# Patient Record
Sex: Male | Born: 1985 | Race: Black or African American | Hispanic: No | Marital: Single
Health system: Southern US, Community
[De-identification: ages and names within clinical notes are randomized; demographics above are authoritative.]

## PROBLEM LIST (undated history)

## (undated) DIAGNOSIS — K089 Disorder of teeth and supporting structures, unspecified: Secondary | ICD-10-CM

## (undated) DIAGNOSIS — F840 Autistic disorder: Secondary | ICD-10-CM

## (undated) DIAGNOSIS — F79 Unspecified intellectual disabilities: Secondary | ICD-10-CM

## (undated) DIAGNOSIS — E119 Type 2 diabetes mellitus without complications: Secondary | ICD-10-CM

---
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
## 2006-05-16 ENCOUNTER — Emergency Department (HOSPITAL_COMMUNITY): Admission: EM | Admit: 2006-05-16 | Discharge: 2006-05-16 | Payer: Self-pay | Admitting: Family Medicine

## 2006-05-20 ENCOUNTER — Ambulatory Visit: Payer: Self-pay | Admitting: Nurse Practitioner

## 2006-06-03 ENCOUNTER — Ambulatory Visit (HOSPITAL_COMMUNITY): Admission: RE | Admit: 2006-06-03 | Discharge: 2006-06-03 | Payer: Self-pay | Admitting: Otolaryngology

## 2006-06-19 ENCOUNTER — Ambulatory Visit (HOSPITAL_COMMUNITY): Admission: RE | Admit: 2006-06-19 | Discharge: 2006-06-19 | Payer: Self-pay | Admitting: Otolaryngology

## 2007-05-23 ENCOUNTER — Emergency Department (HOSPITAL_COMMUNITY): Admission: EM | Admit: 2007-05-23 | Discharge: 2007-05-23 | Payer: Self-pay | Admitting: Emergency Medicine

## 2010-12-29 NOTE — Op Note (Signed)
Glenn [name], Glenn [name]               ACCOUNT NO.:  [id number]   MEDICAL RECORD NO.:  [id number]          PATIENT TYPE:  AMB   LOCATION:  SDS                          FACILITY:  [facility]   PHYSICIAN:  Zola Button T. Lazarus Salines, M.D. DATE OF BIRTH:  [birth year]   DATE OF PROCEDURE:  06/19/2006  DATE OF DISCHARGE:                                 OPERATIVE REPORT   PREOPERATIVE DIAGNOSIS:  Left auricular hematoma, status post incision and  drainage.   POSTOP DIAGNOSIS:  Left auricular hematoma, status post incision and  drainage.   PROCEDURE PERFORMED:  Suture removal, left auricle.   SURGEON:  Gloris Manchester. Lazarus Salines, M.D.   ANESTHESIA:  General mask.   BLOOD LOSS:  None.   COMPLICATIONS:  None.   FINDINGS:  Quilting sutures in the upper left pinna.  Status post incision  and drainage and quilting of an auricular hematoma.  Slight thickening, but  otherwise satisfactory configuration of the ear.  No signs of infection or  residual hematoma.   PROCEDURE:  With the patient having received oral Versed and intramuscular  ketamine in the holding area, mask anesthesia was administered.  An  appropriate level, intravenous access was obtained.  With the patient in a  controlled situation, the left ear was examined with the findings as  described above.  Sutures were removed without difficulty.  At this point  the procedure was completed; and the patient was returned to anesthesia,  awakened, and transferred to recovery in stable condition.   COMMENT:  A 25 year old black male with autism, status post incision and  drainage of a left auricular hematoma approximately 2 weeks ago.  With his  behavioral issues being the primary reason requiring anesthesia for suture  removal.  Given low anticipated risk of postanesthetic or postsurgical  complications, I feel an outpatient venue is appropriate.      Gloris Manchester. Lazarus Salines, M.D.  Electronically Signed     KTW/MEDQ  D:  06/19/2006  T:  06/19/2006  Job:   [id number]
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
## 2011-11-04 ENCOUNTER — Encounter (HOSPITAL_COMMUNITY): Payer: Self-pay | Admitting: General Practice

## 2011-11-04 ENCOUNTER — Emergency Department (HOSPITAL_COMMUNITY)
Admission: EM | Admit: 2011-11-04 | Discharge: 2011-11-04 | Disposition: A | Discharge status: Home / Self Care | Payer: Self-pay | Attending: Emergency Medicine | Admitting: Emergency Medicine

## 2011-11-04 DIAGNOSIS — F79 Unspecified intellectual disabilities: Secondary | ICD-10-CM | POA: Insufficient documentation

## 2011-11-04 DIAGNOSIS — IMO0002 Reserved for concepts with insufficient information to code with codable children: Secondary | ICD-10-CM | POA: Insufficient documentation

## 2011-11-04 DIAGNOSIS — R451 Restlessness and agitation: Secondary | ICD-10-CM

## 2011-11-04 DIAGNOSIS — F840 Autistic disorder: Secondary | ICD-10-CM | POA: Insufficient documentation

## 2011-11-04 HISTORY — DX: Autistic disorder: F84.0

## 2011-11-04 HISTORY — DX: Unspecified intellectual disabilities: F79

## 2011-11-04 MED ORDER — ZOLPIDEM TARTRATE 5 MG PO TABS
5.0000 mg | ORAL_TABLET | Freq: Every evening | ORAL | Status: DC | PRN
Start: 2011-11-04 — End: 2012-06-12

## 2011-11-04 NOTE — ED Provider Notes (Signed)
History     CSN: [id number]  Arrival date & time 11/04/11  0445   First MD Initiated Contact with Patient 11/04/11 0540      Chief Complaint  Patient presents with  . Agitation    (Consider location/radiation/quality/duration/timing/severity/associated sxs/prior treatment) HPI Comments: 26 year old male with a history of mental retardation and autism who takes Seroquel daily presents with increased agitation and mood swings over the last couple of days. The mother states that he has been sleeping okay at night but during the day he sometimes becomes more agitated and has been swinging at walls and that her. She states that this has been intermittent over his life, she does not feel any concern for her safety and states that the hitting his normal behavior for him. She also states that he has been biting on his fingers occasionally. There is no fevers or vomiting chills rashes swelling diarrhea or any other complaints. He has been seen by mental health and in treated with Seroquel with minimal improvement since November.  The patient's baseline is that he is ambulatory but does not speak because of his severe mental retardation  The history is provided by a parent.    Past Medical History  Diagnosis Date  . Autism   . Mental retardation     History reviewed. No pertinent past surgical history.  No family history on file.  History  Substance Use Topics  . Smoking status: Never Smoker   . Smokeless tobacco: Not on file  . Alcohol Use: No      Review of Systems  Unable to perform ROS: Other    Allergies  Review of patient's allergies indicates no known allergies.  Home Medications   Current Outpatient Rx  Name Route Sig Dispense Refill  . QUETIAPINE FUMARATE 100 MG PO TABS Oral Take 200-300 mg by mouth 2 (two) times daily. Takes 200mg  in the morning and 300mg  in the evening    . ZOLPIDEM TARTRATE 5 MG PO TABS Oral Take 1 tablet (5 mg total) by mouth at bedtime as  needed for sleep. 5 tablet 0    Pulse 65  SpO2 94%  Physical Exam  Nursing note and vitals reviewed. Constitutional: He appears well-developed and well-nourished. No distress.  HENT:  Head: Normocephalic and atraumatic.  Mouth/Throat: Oropharynx is clear and moist. No oropharyngeal exudate.  Eyes: Conjunctivae and EOM are normal. Pupils are equal, round, and reactive to light. Right eye exhibits no discharge. Left eye exhibits no discharge. No scleral icterus.  Neck: Normal range of motion. Neck supple. No JVD present. No thyromegaly present.  Cardiovascular: Normal rate, regular rhythm, normal heart sounds and intact distal pulses.  Exam reveals no gallop and no friction rub.   No murmur heard. Pulmonary/Chest: Effort normal and breath sounds normal. No respiratory distress. He has no wheezes. He has no rales.  Abdominal: Soft. Bowel sounds are normal. He exhibits no distension and no mass. There is no tenderness.  Musculoskeletal: Normal range of motion. He exhibits no edema and no tenderness.  Lymphadenopathy:    He has no cervical adenopathy.  Neurological: He is alert. Coordination normal.  Skin: Skin is warm and dry. No rash noted. No erythema.  Psychiatric:       Patient presents physical exam but is willing to hold the stethoscope in his hand and place and where it needs to go. Opens mouth and has clear mucous membranes with moist mucous membranes, clear conjunctiva, normal pupillary exam, follows commands  ED Course  Procedures (including critical care time)  Labs Reviewed - No data to display No results found.   1. Mental retardation   2. Autism   3. Agitation       MDM  Patient appears at his baseline at this time, mother is in agreement, I do not see the need to do laboratory workup were extensive testing as it appears that he has slight increased agitation but can followup safely at mental health tomorrow. The mother states this is her plan but will bring him  back should his symptoms worsen. I think this is stable and he should be safe for discharge at this time.        Vida Roller, MD 11/04/11 [phone]

## 2011-11-04 NOTE — ED Notes (Signed)
With mother, states pt is low functioning autistic pt. Reports pt has been biting hands and fingers and punching walls.

## 2011-11-04 NOTE — Discharge Instructions (Signed)
Please take Glenn [name] to his doctor on Monday morning for a repeat evaluation. Return to the hospital at [facility] long should his symptoms worsen.

## 2012-06-12 ENCOUNTER — Emergency Department (HOSPITAL_COMMUNITY)
Admission: EM | Admit: 2012-06-12 | Discharge: 2012-06-13 | Disposition: A | Discharge status: Home / Self Care | Payer: Medicaid Other | Attending: Emergency Medicine | Admitting: Emergency Medicine

## 2012-06-12 ENCOUNTER — Encounter (HOSPITAL_COMMUNITY): Payer: Self-pay | Admitting: Adult Health

## 2012-06-12 DIAGNOSIS — G4700 Insomnia, unspecified: Secondary | ICD-10-CM

## 2012-06-12 DIAGNOSIS — R454 Irritability and anger: Secondary | ICD-10-CM

## 2012-06-12 DIAGNOSIS — F911 Conduct disorder, childhood-onset type: Secondary | ICD-10-CM | POA: Insufficient documentation

## 2012-06-12 DIAGNOSIS — F840 Autistic disorder: Secondary | ICD-10-CM | POA: Insufficient documentation

## 2012-06-12 DIAGNOSIS — F79 Unspecified intellectual disabilities: Secondary | ICD-10-CM | POA: Insufficient documentation

## 2012-06-12 NOTE — ED Notes (Signed)
Autistic, mute pt with mother that had an episode of anger with mother this evening. Mother states he was hitting wall and at times that is like him. Pt is diaphoretic, pale and has some redness to eyes. Mother states he has had some mucuos and cough.  Mother is also concerned because he has not been able to sleep for one week.

## 2012-06-12 NOTE — ED Notes (Signed)
Pt will not allow [place] to do Vital signs

## 2012-06-12 NOTE — ED Notes (Signed)
Pt. Autistic, mute. Mother at bedside. Concerned with patient increasing aggression x 1 week. Pt on Risperdal since august.

## 2012-06-13 MED ORDER — LORAZEPAM 1 MG PO TABS
1.0000 mg | ORAL_TABLET | Freq: Once | ORAL | Status: AC
  Administered 2012-06-13: 1 mg via ORAL
  Filled 2012-06-13: qty 1

## 2012-06-13 MED ORDER — ZOLPIDEM TARTRATE 5 MG PO TABS: 5.0000 mg | ORAL_TABLET | Freq: Every evening | ORAL | Status: DC | PRN

## 2012-06-13 NOTE — ED Notes (Signed)
Unable to obtain vital signs. Pt. Becomes aggressive.

## 2012-06-13 NOTE — ED Provider Notes (Addendum)
History     CSN: [id number]  Arrival date & time 06/12/12  2253   First MD Initiated Contact with Patient 06/13/12 0007      Chief Complaint  Patient presents with  . Fatigue    (Consider location/radiation/quality/duration/timing/severity/associated sxs/prior treatment) The history is provided by a parent. The history is limited by the condition of the patient (autism).   26 year old of cystic male had an episode of anger and seeming repackaged wall. This is not unusual for him and there was no apparent injury. Mother is noted some nasal congestion but no fever or chills. Mother also states that he has not slept for the last 5 days.  Past Medical History  Diagnosis Date  . Autism   . Mental retardation     History reviewed. No pertinent past surgical history.  History reviewed. No pertinent family history.  History  Substance Use Topics  . Smoking status: Never Smoker   . Smokeless tobacco: Not on file  . Alcohol Use: No      Review of Systems  Unable to perform ROS: Psychiatric disorder    Allergies  Review of patient's allergies indicates no known allergies.  Home Medications   Current Outpatient Rx  Name Route Sig Dispense Refill  . RISPERIDONE 2 MG PO TABS Oral Take 2 mg by mouth 3 (three) times daily.    Marland Kitchen ZOLPIDEM TARTRATE 5 MG PO TABS Oral Take 5 mg by mouth at bedtime as needed. For sleep      There were no vitals taken for this visit.  Physical Exam  Nursing note and vitals reviewed. 26 year old male, resting comfortably and in no acute distress. Vital signs are unable to be obtained because of poor cooperation, but he is noted to have normal respirations and normal heart rate on my physical exam, and he does not appear to have a fever by touch. Head is normocephalic and atraumatic. PERRLA, EOMI. Oropharynx is clear. Neck is nontender and supple without adenopathy or JVD. Back is nontender and there is no CVA tenderness. Lungs are clear without  rales, wheezes, or rhonchi. Chest is nontender. Heart has regular rate and rhythm without murmur. Abdomen is soft, flat, nontender without masses or hepatosplenomegaly and peristalsis is normoactive. Extremities have no cyanosis or edema, full range of motion is present. Skin is warm and dry without rash. Neurologic: He is awake and alert and reasonably cooperative. He will move my stethoscope to the area that needs to be checked under my direction although he'll not allow me to place a stethoscope on my own, cranial nerves are intact, there are no gross motor or sensory deficits.   ED Course  Procedures (including critical care time)    1. Anger   2. Insomnia       MDM  Anger episode, and insomnia. He is given a dose of lorazepam in the emergency department and sent home with a prescription for zolpidem. Prior charts are reviewed and he had an emergency department visit with similar complaints in March of this year which was also treated with zolpidem.        Dione Booze, MD 06/13/12 7829  Dione Booze, MD 06/13/12 [phone]

## 2013-09-30 ENCOUNTER — Emergency Department (INDEPENDENT_AMBULATORY_CARE_PROVIDER_SITE_OTHER)
Admission: EM | Admit: 2013-09-30 | Discharge: 2013-09-30 | Disposition: A | Discharge status: Home / Self Care | Payer: Medicaid Other | Source: Home / Self Care | Attending: Emergency Medicine | Admitting: Emergency Medicine

## 2013-09-30 ENCOUNTER — Emergency Department (INDEPENDENT_AMBULATORY_CARE_PROVIDER_SITE_OTHER): Payer: Medicaid Other

## 2013-09-30 ENCOUNTER — Encounter (HOSPITAL_COMMUNITY): Payer: Self-pay | Admitting: Emergency Medicine

## 2013-09-30 DIAGNOSIS — S60229A Contusion of unspecified hand, initial encounter: Secondary | ICD-10-CM

## 2013-09-30 DIAGNOSIS — S60222A Contusion of left hand, initial encounter: Secondary | ICD-10-CM
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
## 2013-09-30 NOTE — ED Provider Notes (Signed)
Medical screening examination/treatment/procedure(s) were performed by non-physician practitioner and as supervising physician I was immediately available for consultation/collaboration.  Leslee Homeavid Terelle Dobler, M.D.  Reuben Likesavid C Chelle Cayton, MD 09/30/13 [phone]

## 2013-09-30 NOTE — Discharge Instructions (Signed)
Hand Contusion A hand contusion is a deep bruise on your hand area. Contusions are the result of an injury that caused bleeding under the skin. The contusion may turn blue, purple, or yellow. Minor injuries will give you a painless contusion, but more severe contusions may stay painful and swollen for a few weeks. CAUSES  A contusion is usually caused by a blow, trauma, or direct force to an area of the body. SYMPTOMS   Swelling and redness of the injured area.  Discoloration of the injured area.  Tenderness and soreness of the injured area.  Pain. DIAGNOSIS  The diagnosis can be made by taking a history and performing a physical exam. An X-ray, CT scan, or MRI may be needed to determine if there were any associated injuries, such as broken bones (fractures). TREATMENT  Often, the best treatment for a hand contusion is resting, elevating, icing, and applying cold compresses to the injured area. Over-the-counter medicines may also be recommended for pain control. HOME CARE INSTRUCTIONS   Put ice on the injured area.  Put ice in a plastic bag.  Place a towel between your skin and the bag.  Leave the ice on for 15-20 minutes, 03-04 times a day.  Only take over-the-counter or prescription medicines as directed by your caregiver. Your caregiver may recommend avoiding anti-inflammatory medicines (aspirin, ibuprofen, and naproxen) for 48 hours because these medicines may increase bruising.  If told, use an elastic wrap as directed. This can help reduce swelling. You may remove the wrap for sleeping, showering, and bathing. If your fingers become numb, cold, or blue, take the wrap off and reapply it more loosely.  Elevate your hand with pillows to reduce swelling.  Avoid overusing your hand if it is painful. SEEK IMMEDIATE MEDICAL CARE IF:   You have increased redness, swelling, or pain in your hand.  Your swelling or pain is not relieved with medicines.  You have loss of feeling in  your hand or are unable to move your fingers.  Your hand turns cold or blue.  You have pain when you move your fingers.  Your hand becomes warm to the touch.  Your contusion does not improve in 2 days. MAKE SURE YOU:   Understand these instructions.  Will watch your condition.  Will get help right away if you are not doing well or get worse. Document Released: 01/19/2002 Document Revised: 04/23/2012 Document Reviewed: 01/21/2012 [facility] Patient Information 2014 [place], [place].   Your son's xrays were normal. Please keep dressing placed at the urgent care in place for 24 hours to help reduce swelling, then you may remove. Expect swelling to improve over next few days. Ice and elevation will also help reduce pain and swelling.

## 2013-09-30 NOTE — ED Provider Notes (Signed)
CSN: [id number]     Arrival date & time 09/30/13  1230 History   First MD Initiated Contact with Patient 09/30/13 1429     Chief Complaint  Patient presents with  . Hand Injury     (Consider location/radiation/quality/duration/timing/severity/associated sxs/prior Treatment) HPI Comments: [facility] who provide his history as he is non-verbal as a result of autism and severe developmental delay. Mother reports that patient's sister noticed a swollen area on the dorsum of his left hand today and then made mother aware of finding. Mother states that child can become aggressive and will often hit objects when he is angry but she is uncertain if specific injury has occurred.   Patient is a 28 y.o. male presenting with hand injury. The history is provided by a parent.  Hand Injury   Past Medical History  Diagnosis Date  . Autism   . Mental retardation    History reviewed. No pertinent past surgical history. No family history on file. History  Substance Use Topics  . Smoking status: Never Smoker   . Smokeless tobacco: Not on file  . Alcohol Use: No    Review of Systems  Unable to perform ROS: Patient nonverbal      Allergies  Review of patient's allergies indicates no known allergies.  Home Medications   Current Outpatient Rx  Name  Route  Sig  Dispense  Refill  . QUEtiapine Fumarate (SEROQUEL PO)   Oral   Take by mouth.         . risperiDONE (RISPERDAL) 2 MG tablet   Oral   Take 2 mg by mouth 3 (three) times daily.         Marland Kitchen. zolpidem (AMBIEN) 5 MG tablet   Oral   Take 5 mg by mouth at bedtime as needed. For sleep         . zolpidem (AMBIEN) 5 MG tablet   Oral   Take 1 tablet (5 mg total) by mouth at bedtime as needed for sleep.   5 tablet   0    Pulse 98  Temp(Src) 98.2 F (36.8 C) (Oral)  Resp 99  SpO2 99% Physical Exam  Nursing note and vitals reviewed. Constitutional: He appears well-developed and well-nourished. No  distress.  HENT:  Head: Normocephalic and atraumatic.  Eyes: Conjunctivae are normal.  Cardiovascular: Normal rate.   Pulmonary/Chest: Effort normal.  RR during my exam found to be 16 breaths per minute  Musculoskeletal: Normal range of motion.       Left hand: He exhibits tenderness and swelling. He exhibits normal range of motion, no bony tenderness, no deformity and no laceration. Decreased strength noted. He exhibits no finger abduction.       Hands: Normal ROM at left hand, wrist and fingers.  Neurological: He is alert.  Skin: Skin is warm and dry.  Psychiatric: He has a normal mood and affect. His behavior is normal.    ED Course  Procedures (including critical care time) Labs Review Labs Reviewed - No data to display Imaging Review No results found.    MDM   Final diagnoses:  None  Left hand contusion with hematoma. Radiographs negative for fx or dislocation. . Will place in padded Ace wrap dressing (Watson-Jones dressing) and advise parents to keep elevated as much as possible and to remove dressing in 24-36 hours.     Jess BartersJennifer Lee ScottdalePresson, GeorgiaPA 09/30/13 [phone]

## 2013-09-30 NOTE — ED Notes (Signed)
Ace wrap patient's left hand due to swelling

## 2015-06-20 ENCOUNTER — Emergency Department (HOSPITAL_COMMUNITY)
Admission: EM | Admit: 2015-06-20 | Discharge: 2015-06-20 | Disposition: A | Discharge status: Home / Self Care | Payer: Medicaid Other | Attending: Emergency Medicine | Admitting: Emergency Medicine

## 2015-06-20 ENCOUNTER — Encounter (HOSPITAL_COMMUNITY): Payer: Self-pay | Admitting: *Deleted

## 2015-06-20 DIAGNOSIS — Y92410 Unspecified street and highway as the place of occurrence of the external cause: Secondary | ICD-10-CM | POA: Diagnosis not present

## 2015-06-20 DIAGNOSIS — F840 Autistic disorder: Secondary | ICD-10-CM | POA: Insufficient documentation

## 2015-06-20 DIAGNOSIS — Z041 Encounter for examination and observation following transport accident: Secondary | ICD-10-CM | POA: Diagnosis not present

## 2015-06-20 DIAGNOSIS — Y9389 Activity, other specified: Secondary | ICD-10-CM | POA: Insufficient documentation

## 2015-06-20 DIAGNOSIS — Y999 Unspecified external cause status: Secondary | ICD-10-CM | POA: Diagnosis not present

## 2015-06-20 DIAGNOSIS — Z79899 Other long term (current) drug therapy: Secondary | ICD-10-CM | POA: Insufficient documentation

## 2015-06-20 MED ORDER — IBUPROFEN 800 MG PO TABS: 800.0000 mg | ORAL_TABLET | Freq: Once | ORAL | Status: DC

## 2015-06-20 MED ORDER — CYCLOBENZAPRINE HCL 10 MG PO TABS: 5.0000 mg | ORAL_TABLET | Freq: Once | ORAL | Status: DC

## 2015-06-20 NOTE — ED Notes (Signed)
Attempted to take vitals on patient. Patient refused to have his vitals taken for the second time.
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
## 2015-06-20 NOTE — Discharge Instructions (Signed)

## 2015-06-20 NOTE — ED Notes (Signed)
Patient did not let me take his vitals

## 2015-06-20 NOTE — ED Notes (Signed)
pts mother states that pt is acting himself. No difficulty ambulating, no change in mental status, no change in appetite.
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
## 2016-06-06 ENCOUNTER — Encounter (HOSPITAL_COMMUNITY): Payer: Self-pay | Admitting: Emergency Medicine

## 2016-06-06 ENCOUNTER — Ambulatory Visit (HOSPITAL_COMMUNITY)
Admission: EM | Admit: 2016-06-06 | Discharge: 2016-06-06 | Disposition: A | Discharge status: Home / Self Care | Payer: Medicaid Other | Attending: Emergency Medicine | Admitting: Emergency Medicine

## 2016-06-06 DIAGNOSIS — H1033 Unspecified acute conjunctivitis, bilateral: Secondary | ICD-10-CM | POA: Diagnosis not present

## 2016-06-06 MED ORDER — GENTAMICIN SULFATE 0.3 % OP SOLN: 1.0000 [drp] | OPHTHALMIC | 0 refills | Status: AC

## 2016-06-06 NOTE — ED Provider Notes (Signed)
CSN: [id number]     Arrival date & time 06/06/16  1119 History   First MD Initiated Contact with Patient 06/06/16 1335     Chief Complaint  Patient presents with  . Eye Problem   (Consider location/radiation/quality/duration/timing/severity/associated sxs/prior Treatment) 30 year old autistic male brought in by his caregiver with concern over bilateral eye discharge and redness for the past 2 days. Wakes up with both eyes matted shut. Mom has been using warm compresses and Visine with minimal relief. No fever, cough or GI symptoms. Has had some nasal congestion and drainage for the past week.    The history is provided by a parent.    Past Medical History:  Diagnosis Date  . Autism   . Mental retardation    History reviewed. No pertinent surgical history. History reviewed. No pertinent family history. Social History  Substance Use Topics  . Smoking status: Never Smoker  . Smokeless tobacco: Never Used  . Alcohol use No    Review of Systems  Constitutional: Negative for chills, fatigue and fever.  HENT: Positive for congestion, postnasal drip and rhinorrhea. Negative for ear discharge, ear pain and sore throat.   Eyes: Positive for discharge, redness and itching. Negative for pain.  Respiratory: Negative for cough and wheezing.   Gastrointestinal: Negative for diarrhea and vomiting.  Skin: Negative for rash.  Neurological: Negative for syncope and weakness.    Allergies  Review of patient's allergies indicates no known allergies.  Home Medications   Prior to Admission medications   Medication Sig Start Date End Date Taking? Authorizing Provider  QUEtiapine Fumarate (SEROQUEL PO) Take by mouth.   Yes Historical Provider, MD  risperiDONE (RISPERDAL) 2 MG tablet Take 2 mg by mouth 3 (three) times daily.   Yes Historical Provider, MD  gentamicin (GARAMYCIN) 0.3 % ophthalmic solution Place 1 drop into both eyes every 4 (four) hours. 06/06/16 06/11/16  Sudie Grumbling, NP   zolpidem (AMBIEN) 5 MG tablet Take 5 mg by mouth at bedtime as needed. For sleep    Historical Provider, MD  zolpidem (AMBIEN) 5 MG tablet Take 1 tablet (5 mg total) by mouth at bedtime as needed for sleep. 06/13/12   Dione Booze, MD   Meds Ordered and Administered this Visit  Medications - No data to display  Pulse 111   Temp 97.6 F (36.4 C) (Temporal)   Resp 20   SpO2 100%  No data found.   Physical Exam  Constitutional: He appears well-developed and well-nourished. He is cooperative. No distress.  HENT:  Head: Normocephalic and atraumatic.  Right Ear: Hearing, tympanic membrane, external ear and ear canal normal.  Left Ear: Hearing, tympanic membrane, external ear and ear canal normal.  Nose: Rhinorrhea present. Right sinus exhibits no maxillary sinus tenderness and no frontal sinus tenderness. Left sinus exhibits no maxillary sinus tenderness and no frontal sinus tenderness.  Mouth/Throat: Uvula is midline, oropharynx is clear and moist and mucous membranes are normal.  Eyes: EOM and lids are normal. Pupils are equal, round, and reactive to light. Right eye exhibits exudate (yellow). Right eye exhibits no hordeolum. Left eye exhibits exudate. Left eye exhibits no hordeolum. Right conjunctiva is injected. Left conjunctiva is injected.  Fundoscopic exam:      The right eye shows red reflex.       The left eye shows red reflex.  Neck: Normal range of motion. Neck supple.  Cardiovascular: Normal rate, regular rhythm and normal heart sounds.   Pulmonary/Chest: Effort normal and  breath sounds normal.  Lymphadenopathy:    He has no cervical adenopathy.  Neurological: He is alert.  Skin: Skin is warm and dry.  Psychiatric: He is agitated.    Urgent Care Course   Clinical Course    Procedures (including critical care time)  Labs Review Labs Reviewed - No data to display  Imaging Review No results found.   Visual Acuity Review  Right Eye Distance:   Left Eye  Distance:   Bilateral Distance:    Right Eye Near:   Left Eye Near:    Bilateral Near:         MDM   1. Acute bacterial conjunctivitis of both eyes    Recommend use Gentamicin eye drops every 4 hours while awake as directed. Continue warm compresses to eyes for comfort. Discussed contagious nature of pink eye- no adult school for 24 hours. Recommend follow-up with his primary care provider in 2 to 3 days if not improving.      Sudie GrumblingAnn Berry Julliette Frentz, NP 06/07/16 1049

## 2016-06-06 NOTE — Discharge Instructions (Signed)
Use Gentamicin drops- 1 drop in each eye about every 4 hours while awake for 5 days. May continue using warm compresses as needed for comfort. Follow-up with your primary care provider in 2 days if not improving.
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
## 2016-10-27 ENCOUNTER — Encounter (HOSPITAL_COMMUNITY): Payer: Self-pay | Admitting: Nurse Practitioner

## 2016-10-27 ENCOUNTER — Emergency Department (HOSPITAL_COMMUNITY)
Admission: EM | Admit: 2016-10-27 | Discharge: 2016-10-27 | Disposition: A | Discharge status: Home / Self Care | Payer: Medicaid Other | Attending: Emergency Medicine | Admitting: Emergency Medicine

## 2016-10-27 DIAGNOSIS — F840 Autistic disorder: Secondary | ICD-10-CM | POA: Diagnosis not present

## 2016-10-27 DIAGNOSIS — Z0000 Encounter for general adult medical examination without abnormal findings: Secondary | ICD-10-CM | POA: Diagnosis present

## 2016-10-27 LAB — URINALYSIS, ROUTINE W REFLEX MICROSCOPIC
Bacteria, UA: NONE SEEN
Bilirubin Urine: NEGATIVE
Glucose, UA: NEGATIVE mg/dL
Ketones, ur: NEGATIVE mg/dL
Leukocytes, UA: NEGATIVE
Nitrite: NEGATIVE
PROTEIN: NEGATIVE mg/dL
SQUAMOUS EPITHELIAL / LPF: NONE SEEN
Specific Gravity, Urine: 1.012 (ref 1.005–1.030)
WBC, UA: NONE SEEN WBC/hpf (ref 0–5)
pH: 7 (ref 5.0–8.0)

## 2016-10-27 NOTE — ED Triage Notes (Signed)
Pt presents with c/o needing lab work. He has severe autism and anxiety. He is completley nonverbal. His mother took him to his PCP this week for a routine follow-up of his chronic medical conditions but they were unable to obtain any vital signs or blood work from the patient due to his behavior. He swung his arms towards his PCP while she tried to examine him and obtain his VS. She did treat him with 10mg  valium in the office with no change in behavior.The decision was made to send pt to ED for possible sedation and lab work due to family concern for wanting lab work.

## 2016-10-27 NOTE — Discharge Instructions (Signed)
If you were given medicines take as directed.  If you are on coumadin or contraceptives realize their levels and effectiveness is altered by many different medicines.  If you have any reaction (rash, tongues swelling, other) to the medicines stop taking and see a physician.    If your blood pressure was elevated in the ER make sure you follow up for management with a primary doctor or return for chest pain, shortness of breath or stroke symptoms.  Please follow up as directed and return to the ER or see a physician for new or worsening symptoms.  Thank you. Vitals:   10/27/16 1537  Pulse: 90  Resp: 19

## 2016-10-27 NOTE — ED Provider Notes (Signed)
[facility] Provider Note   CSN: [id number] Arrival date & time: 10/27/16  1257     History   Chief Complaint Chief Complaint  Patient presents with  . Labs Only    HPI Glenn [name] is a 31 y.o. male.  Patient with autism and no other significant medical history presents with mother requesting blood work. Patient was sent over from primary care's office after they were unable to get screening blood work. I clarified with mother if there are any new symptoms, I behavior, fevers, vomiting, any concerns at all and she says no. Patient is been at baseline for the past week. Intermittently he will have more aggression however that has not been for a few weeks. No other medical problems. Patient eating and drinking okay. Mother also concerned may be diabetes however she has not noticed any classic symptoms.      Past Medical History:  Diagnosis Date  . Autism   . Mental retardation     There are no active problems to display for this patient.   History reviewed. No pertinent surgical history.     Home Medications    Prior to Admission medications   Medication Sig Start Date End Date Taking? Authorizing Provider  QUEtiapine Fumarate (SEROQUEL PO) Take by mouth.    Historical Provider, MD  risperiDONE (RISPERDAL) 2 MG tablet Take 2 mg by mouth 3 (three) times daily.    Historical Provider, MD  zolpidem (AMBIEN) 5 MG tablet Take 5 mg by mouth at bedtime as needed. For sleep    Historical Provider, MD  zolpidem (AMBIEN) 5 MG tablet Take 1 tablet (5 mg total) by mouth at bedtime as needed for sleep. 06/13/12   Dione Booze, MD    Family History History reviewed. No pertinent family history.  Social History Social History  Substance Use Topics  . Smoking status: Never Smoker  . Smokeless tobacco: Never Used  . Alcohol use No     Allergies   Patient has no known allergies.   Review of Systems Review of Systems  Unable to perform ROS: Patient nonverbal      Physical Exam Updated Vital Signs Pulse 90   Resp 19   Physical Exam  Constitutional: He appears well-developed and well-nourished.  HENT:  Head: Normocephalic and atraumatic.  Eyes: Conjunctivae are normal.  Neck: Neck supple.  Cardiovascular: Normal rate and regular rhythm.   No murmur heard. Pulmonary/Chest: Effort normal and breath sounds normal. No respiratory distress.  Abdominal: Soft. There is no tenderness.  Musculoskeletal: He exhibits no edema.  Neurological: He is alert.  Patient has equal pupils bilateral, horizontal eye movements intact, patient ambulates with normal strength and normal balance. Patient has grossly 5+ strength in upper lower extremities.  Patient able to hold my stethoscope on his chest along medial listen.  Skin: Skin is warm and dry.  Psychiatric: His affect is not blunt. He is not aggressive and not combative.  Autistic  Nursing note and vitals reviewed.    ED Treatments / Results  Labs (all labs ordered are listed, but only abnormal results are displayed) Labs Reviewed  URINALYSIS, ROUTINE W REFLEX MICROSCOPIC - Abnormal; Notable for the following:       Result Value   Hgb urine dipstick SMALL (*)    All other components within normal limits    EKG  EKG Interpretation None       Radiology No results found.  Procedures Procedures (including critical care time)  Medications Ordered in  ED Medications - No data to display   Initial Impression / Assessment and Plan / ED Course  I have reviewed the triage vital signs and the nursing notes.  Pertinent labs & imaging results that were available during my care of the patient were reviewed by me and considered in my medical decision making (see chart for details).    Patient with autism presents the ER to discuss screening lab work. Patient has no new symptoms or signs. Discussed this is reasonable if patient will allow however patient does get aggressive when anyone tries to  get a blood pressure cuff on left alone and needle. Patient does not have any urgent need for blood work and discussed with mother I do not feel the risk of sedating the patient or holding him down with security is indicated at this time. Mother does agree with me on this point and we will get a urinalysis and have him follow up outpatient  Results and differential diagnosis were discussed with the patient/parent/guardian. Xrays were independently reviewed by myself.  Close follow up outpatient was discussed, comfortable with the plan.   Medications - No data to display  Vitals:   10/27/16 1537  Pulse: 90  Resp: 19    Final diagnoses:  Autism  Autistic behavior     Final Clinical Impressions(s) / ED Diagnoses   Final diagnoses:  Autism  Autistic behavior    New Prescriptions New Prescriptions   No medications on file     Blane OharaJoshua Vernadine Coombs, MD 10/27/16 1616

## 2016-10-27 NOTE — ED Notes (Signed)
Unable to obtain vital signs on pt.

## 2016-10-27 NOTE — ED Notes (Signed)
Pt refusing all other vitals. Pt skin warm, dry, appropriate color, pt ambulatory, smiling.

## 2016-10-27 NOTE — ED Notes (Signed)
Pt refusing vital signs, pt refusing to be placed on monitor, pt refusing to be placed in gown, pt pushing people away.

## 2017-08-19 ENCOUNTER — Other Ambulatory Visit: Payer: Self-pay

## 2017-08-19 ENCOUNTER — Ambulatory Visit (HOSPITAL_COMMUNITY)
Admission: EM | Admit: 2017-08-19 | Discharge: 2017-08-19 | Disposition: A | Discharge status: Home / Self Care | Payer: Medicaid Other | Attending: Family Medicine | Admitting: Family Medicine

## 2017-08-19 ENCOUNTER — Encounter (HOSPITAL_COMMUNITY): Payer: Self-pay | Admitting: Emergency Medicine

## 2017-08-19 DIAGNOSIS — H1033 Unspecified acute conjunctivitis, bilateral: Secondary | ICD-10-CM

## 2017-08-19 DIAGNOSIS — B349 Viral infection, unspecified: Secondary | ICD-10-CM

## 2017-08-19 MED ORDER — SULFACETAMIDE SODIUM 10 % OP SOLN: 1.0000 [drp] | Freq: Four times a day (QID) | OPHTHALMIC | 0 refills | Status: AC

## 2017-08-19 MED ORDER — CETIRIZINE HCL 1 MG/ML PO SOLN: 10.0000 mg | Freq: Every day | ORAL | 0 refills | Status: DC

## 2017-08-19 NOTE — ED Triage Notes (Addendum)
Pt has red eyes and drainage bilaterally. He appears to be sensitive to the light.  Pt is nonverbal, autistic.    Pt also has nasal congestion and drainage and cough.

## 2017-08-19 NOTE — Discharge Instructions (Signed)
Use sulfacetamide eyedrops as directed on both eyes. Monitor for any worsening of symptoms, changes in vision, sensitivity to light, eye swelling, follow up with ophthalmology for further evaluation.   Start zyrtec for nasal congestion. Use bulb syringe to remove nasal drainage. Keep hydrated, your urine should be clear to pale yellow in color. Tylenol/motrin for fever and pain. Monitor for any worsening of symptoms, chest pain, shortness of breath, wheezing, swelling of the throat, follow up for reevaluation.

## 2017-08-19 NOTE — ED Provider Notes (Signed)
[facility]    CSN: [id number] Arrival date & time: 08/19/17  1002     History   Chief Complaint Chief Complaint  Patient presents with  . Conjunctivitis    bilateral    HPI Glenn [name] is a 32 y.o. male.   32 year old male who was autistic and nonverbal comes in with mother for 2-3 day history of bilateral red eyes and drainage.  He has been using his hand to cover his eyes.  No obvious changes in vision, he has not been bumping into furniture more, or feeling around the house more.  He has also had URI symptoms for the past few days, with nasal congestion, drainage and cough.  Denies fever, chills, night sweats.  Mother states he has still been eating, although slower.  She has tried cough medicine with some relief.  Has been giving Visine eyedrops, which helped with the red eyes, but mother thinks drops may be irritating him as he now shies away from drops.      Past Medical History:  Diagnosis Date  . Autism   . Mental retardation     There are no active problems to display for this patient.   History reviewed. No pertinent surgical history.     Home Medications    Prior to Admission medications   Medication Sig Start Date End Date Taking? Authorizing Provider  divalproex (DEPAKOTE) 500 MG DR tablet Take 500 mg by mouth 3 (three) times daily.   Yes [provider]  haloperidol (HALDOL) 5 MG tablet Take 5 mg by mouth 2 (two) times daily.   Yes [provider]  LORazepam (ATIVAN) 0.5 MG tablet Take 0.5 mg by mouth every 8 (eight) hours.   Yes [provider]  QUEtiapine Fumarate (SEROQUEL PO) Take by mouth.   Yes [provider]  risperiDONE (RISPERDAL) 2 MG tablet Take 2 mg by mouth 3 (three) times daily.   Yes [provider]  cetirizine HCl (ZYRTEC) 1 MG/ML solution Take 10 mLs (10 mg total) by mouth daily. 08/19/17   Cathie HoopsYu, Joyanne Eddinger V, PA-C  sulfacetamide (BLEPH-10) 10 % ophthalmic solution Place 1-2 drops into  both eyes 4 (four) times daily for 7 days. 08/19/17 08/26/17  Cathie HoopsYu, Mackie Goon V, PA-C  zolpidem (AMBIEN) 5 MG tablet Take 5 mg by mouth at bedtime as needed. For sleep    [provider]  zolpidem (AMBIEN) 5 MG tablet Take 1 tablet (5 mg total) by mouth at bedtime as needed for sleep. 06/13/12   Dione BoozeGlick, David, MD    Family History History reviewed. No pertinent family history.  Social History Social History   Tobacco Use  . Smoking status: Never Smoker  . Smokeless tobacco: Never Used  Substance Use Topics  . Alcohol use: No  . Drug use: No     Allergies   Patient has no known allergies.   Review of Systems Review of Systems  Reason unable to perform ROS: See HPI as above.     Physical Exam Triage Vital Signs ED Triage Vitals [08/19/17 1028]  Enc Vitals Group     BP      Pulse Rate 69     Resp      Temp (!) 97 F (36.1 C)     Temp Source Temporal     SpO2 100 %     Weight      Height      Head Circumference      Peak Flow  Pain Score      Pain Loc      Pain Edu?      Excl. in GC?    No data found.  Updated Vital Signs Pulse 69   Temp (!) 97 F (36.1 C) (Temporal)   SpO2 100%   Visual Acuity Right Eye Distance:  Deferred Left Eye Distance:  Deferred Bilateral Distance:    Right Eye Near:   Left Eye Near:    Bilateral Near:     Physical Exam  Constitutional: He is oriented to person, place, and time. He appears well-developed and well-nourished. No distress.  HENT:  Head: Normocephalic and atraumatic.  Right Ear: External ear and ear canal normal. Tympanic membrane is erythematous. Tympanic membrane is not bulging.  Left Ear: External ear and ear canal normal. Tympanic membrane is erythematous. Tympanic membrane is not bulging.  Nose: Mucosal edema and rhinorrhea present. Right sinus exhibits no maxillary sinus tenderness and no frontal sinus tenderness. Left sinus exhibits no maxillary sinus tenderness and no frontal sinus tenderness.  Unable  to see oropharynx due to patient's resistance.  Eyes: EOM and lids are normal. Pupils are equal, round, and reactive to light. Lids are everted and swept, no foreign bodies found. Right eye exhibits discharge. Left eye exhibits discharge. Right conjunctiva is injected. Left conjunctiva is injected.  Neck: Normal range of motion. Neck supple.  Cardiovascular: Normal rate, regular rhythm and normal heart sounds. Exam reveals no gallop and no friction rub.  No murmur heard. Pulmonary/Chest: Effort normal and breath sounds normal. He has no decreased breath sounds. He has no wheezes. He has no rhonchi. He has no rales.  Lymphadenopathy:    He has no cervical adenopathy.  Neurological: He is alert and oriented to person, place, and time.  Skin: Skin is warm and dry.  Psychiatric: He has a normal mood and affect. His behavior is normal. Judgment normal.     UC Treatments / Results  Labs (all labs ordered are listed, but only abnormal results are displayed) Labs Reviewed - No data to display  EKG  EKG Interpretation None       Radiology No results found.  Procedures Procedures (including critical care time)  Medications Ordered in UC Medications - No data to display   Initial Impression / Assessment and Plan / UC Course  I have reviewed the triage vital signs and the nursing notes.  Pertinent labs & imaging results that were available during my care of the patient were reviewed by me and considered in my medical decision making (see chart for details).    Unable to obtain visual acuity, do fluorescein stain due to patient's cooperation.  However, I redness with discharge seen on exam.  No obvious photophobia, patient was able to keep his eyes open during exam.  Discussed with mother given viral URI, conjunctivitis could be viral versus bacterial.  However, given the copious discharge seen on exam, patient nonverbal, will treat for bacterial conjunctivitis.  Start sulfacetamide as  directed.  Other symptomatic treatment discussed.  Return precautions given.  Mother expresses understanding and agrees to plan.  Final Clinical Impressions(s) / UC Diagnoses   Final diagnoses:  Acute conjunctivitis of both eyes, unspecified acute conjunctivitis type  Viral illness    ED Discharge Orders        Ordered    sulfacetamide (BLEPH-10) 10 % ophthalmic solution  4 times daily     08/19/17 1105    cetirizine HCl (ZYRTEC) 1 MG/ML solution  Daily  08/19/17 1105        Belinda Fisher, PA-C 08/19/17 1113

## 2017-09-16 ENCOUNTER — Emergency Department (HOSPITAL_COMMUNITY)
Admission: EM | Admit: 2017-09-16 | Discharge: 2017-09-16 | Disposition: A | Discharge status: Home / Self Care | Payer: Medicaid Other | Attending: Emergency Medicine | Admitting: Emergency Medicine

## 2017-09-16 ENCOUNTER — Encounter (HOSPITAL_COMMUNITY): Payer: Self-pay | Admitting: *Deleted

## 2017-09-16 ENCOUNTER — Other Ambulatory Visit: Payer: Self-pay

## 2017-09-16 DIAGNOSIS — Z5321 Procedure and treatment not carried out due to patient leaving prior to being seen by health care provider: Secondary | ICD-10-CM | POA: Diagnosis not present

## 2017-09-16 DIAGNOSIS — Z0130 Encounter for examination of blood pressure without abnormal findings: Secondary | ICD-10-CM | POA: Insufficient documentation

## 2017-09-16 LAB — URINALYSIS, ROUTINE W REFLEX MICROSCOPIC
Bilirubin Urine: NEGATIVE
Glucose, UA: NEGATIVE mg/dL
Hgb urine dipstick: NEGATIVE
Ketones, ur: NEGATIVE mg/dL
Leukocytes, UA: NEGATIVE
Nitrite: NEGATIVE
Protein, ur: NEGATIVE mg/dL
Specific Gravity, Urine: 1.017 (ref 1.005–1.030)
pH: 5 (ref 5.0–8.0)

## 2017-09-16 NOTE — ED Notes (Signed)
[facility] staff, Geraldine SolarJoe SUGGS, [phone], to be consulted as need.

## 2017-09-16 NOTE — Progress Notes (Addendum)
CSW received phone call from [employer] Worker Joe Suggs. Worker brought pt into the ED after going to his primary care doctor earlier today. Primary Care Doctor told [employer] Worker to bring pt to the ED for blood work. Concerns from primary care doctor are that pt might be diabetic. PCP has been unable to draw blood from pt after multiple visits and attempts.   Montine CircleKelsy Alailah Safley, Silverio LayLCSWA [facility]  [phone]

## 2017-09-16 NOTE — ED Triage Notes (Addendum)
Pt is here with patient and is autistic. Pt has not had lab work done in a while. Pt has been drinking excessive amount of water.  DSS is the guardian, but mother here too.  They want patient checked for diabetes and blood pressure. Unable to get blood pressure, other vitals entered. Urine collected

## 2017-09-16 NOTE — ED Notes (Signed)
Pt is non-verbal, and autistic and refuses for writer to get blood pressure.

## 2017-09-16 NOTE — ED Notes (Signed)
Unable to locate patient, or Family in lobby to bring patient to room. 

## 2017-09-16 NOTE — ED Notes (Signed)
Unable to locate patient, or Family in lobby to bring patient to room.

## 2017-11-04 ENCOUNTER — Ambulatory Visit (HOSPITAL_COMMUNITY)
Admission: EM | Admit: 2017-11-04 | Discharge: 2017-11-04 | Disposition: A | Discharge status: Home / Self Care | Payer: Medicaid Other | Attending: Family Medicine | Admitting: Family Medicine

## 2017-11-04 ENCOUNTER — Encounter (HOSPITAL_COMMUNITY): Payer: Self-pay | Admitting: Emergency Medicine

## 2017-11-04 DIAGNOSIS — J0140 Acute pansinusitis, unspecified: Secondary | ICD-10-CM | POA: Diagnosis not present

## 2017-11-04 MED ORDER — CETIRIZINE HCL 10 MG PO TABS: 10.0000 mg | ORAL_TABLET | Freq: Every day | ORAL | 0 refills | Status: DC

## 2017-11-04 MED ORDER — FLUTICASONE PROPIONATE 50 MCG/ACT NA SUSP: 2.0000 | Freq: Every day | NASAL | 0 refills | Status: DC

## 2017-11-04 MED ORDER — IPRATROPIUM BROMIDE 0.06 % NA SOLN: 2.0000 | Freq: Four times a day (QID) | NASAL | 0 refills | Status: DC

## 2017-11-04 MED ORDER — AMOXICILLIN-POT CLAVULANATE 875-125 MG PO TABS: 1.0000 | ORAL_TABLET | Freq: Two times a day (BID) | ORAL | 0 refills | Status: DC

## 2017-11-04 NOTE — Discharge Instructions (Addendum)
Start augmentin for sinus infection. Start flonase, atrovent nasal spray, zyrtec for nasal congestion/drainage. You can use over the counter nasal saline rinse such as neti pot for nasal congestion. Keep hydrated, your urine should be clear to pale yellow in color. Tylenol/motrin for fever and pain. Monitor for any worsening of symptoms, chest pain, shortness of breath, wheezing, swelling of the throat, follow up for reevaluation.

## 2017-11-04 NOTE — ED Provider Notes (Signed)
[facility]    CSN: [id number] Arrival date & time: 11/04/17  1034     History   Chief Complaint Chief Complaint  Patient presents with  . URI    HPI Glenn [name] is a 32 y.o. male.   32 year old male with history of autism, mental retardation comes in with caregiver for 10-day history of URI symptoms.  He has had rhinorrhea, nasal congestion, cough.  Denies fever, chills, night sweats.  Patient has been eating and drinking without problems.  Caregiver states he has been active and playing like his usual self.  Has been giving over-the-counter cold medication with mild relief.     Past Medical History:  Diagnosis Date  . Autism   . Mental retardation     There are no active problems to display for this patient.   History reviewed. No pertinent surgical history.     Home Medications    Prior to Admission medications   Medication Sig Start Date End Date Taking? Authorizing Provider  amoxicillin-clavulanate (AUGMENTIN) 875-125 MG tablet Take 1 tablet by mouth every 12 (twelve) hours. 11/04/17   Cathie HoopsYu, Leslieann Whisman V, PA-C  cetirizine (ZYRTEC) 10 MG tablet Take 1 tablet (10 mg total) by mouth daily. 11/04/17   Cathie HoopsYu, Kathi Dohn V, PA-C  divalproex (DEPAKOTE) 500 MG DR tablet Take 500 mg by mouth 3 (three) times daily.    [provider]  fluticasone (FLONASE) 50 MCG/ACT nasal spray Place 2 sprays into both nostrils daily. 11/04/17   Cathie HoopsYu, Margarito Dehaas V, PA-C  haloperidol (HALDOL) 5 MG tablet Take 5 mg by mouth 2 (two) times daily.    [provider]  ipratropium (ATROVENT) 0.06 % nasal spray Place 2 sprays into both nostrils 4 (four) times daily. 11/04/17   Cathie HoopsYu, Telford Archambeau V, PA-C  LORazepam (ATIVAN) 0.5 MG tablet Take 0.5 mg by mouth every 8 (eight) hours.    [provider]  QUEtiapine Fumarate (SEROQUEL PO) Take by mouth.    [provider]  risperiDONE (RISPERDAL) 2 MG tablet Take 2 mg by mouth 3 (three) times daily.    [provider]  zolpidem  (AMBIEN) 5 MG tablet Take 5 mg by mouth at bedtime as needed. For sleep    [provider]  zolpidem (AMBIEN) 5 MG tablet Take 1 tablet (5 mg total) by mouth at bedtime as needed for sleep. 06/13/12   Dione BoozeGlick, David, MD    Family History History reviewed. No pertinent family history.  Social History Social History   Tobacco Use  . Smoking status: Never Smoker  . Smokeless tobacco: Never Used  Substance Use Topics  . Alcohol use: No  . Drug use: No     Allergies   Patient has no known allergies.   Review of Systems Review of Systems  Reason unable to perform ROS: See HPI as above.     Physical Exam Triage Vital Signs ED Triage Vitals [11/04/17 1142]  Enc Vitals Group     BP      Pulse Rate (!) 101     Resp 18     Temp 98.1 F (36.7 C)     Temp Source Oral     SpO2 100 %     Weight      Height      Head Circumference      Peak Flow      Pain Score      Pain Loc      Pain Edu?  Excl. in GC?    No data found.  Updated Vital Signs Pulse (!) 101   Temp 98.1 F (36.7 C) (Oral)   Resp 18   SpO2 100%   Physical Exam  Constitutional: He is oriented to person, place, and time. He appears well-developed and well-nourished. No distress.  HENT:  Head: Normocephalic and atraumatic.  Right Ear: Tympanic membrane, external ear and ear canal normal. Tympanic membrane is not erythematous and not bulging.  Left Ear: Tympanic membrane, external ear and ear canal normal. Tympanic membrane is not erythematous and not bulging.  Nose: Mucosal edema and rhinorrhea present. Right sinus exhibits no maxillary sinus tenderness and no frontal sinus tenderness. Left sinus exhibits no maxillary sinus tenderness and no frontal sinus tenderness.  Mouth/Throat: Uvula is midline, oropharynx is clear and moist and mucous membranes are normal.  Eyes: Pupils are equal, round, and reactive to light. Conjunctivae are normal.  Neck: Normal range of motion. Neck supple.    Cardiovascular: Normal rate, regular rhythm and normal heart sounds. Exam reveals no gallop and no friction rub.  No murmur heard. Pulmonary/Chest: Effort normal and breath sounds normal. He has no decreased breath sounds. He has no wheezes. He has no rhonchi. He has no rales.  Lymphadenopathy:    He has no cervical adenopathy.  Neurological: He is alert and oriented to person, place, and time.  Skin: Skin is warm and dry.  Psychiatric: He has a normal mood and affect. His behavior is normal. Judgment normal.     UC Treatments / Results  Labs (all labs ordered are listed, but only abnormal results are displayed) Labs Reviewed - No data to display  EKG None Radiology No results found.  Procedures Procedures (including critical care time)  Medications Ordered in UC Medications - No data to display   Initial Impression / Assessment and Plan / UC Course  I have reviewed the triage vital signs and the nursing notes.  Pertinent labs & imaging results that were available during my care of the patient were reviewed by me and considered in my medical decision making (see chart for details).    Will treat for sinusitis with augmentin. Other symptomatic treatment discussed. Push fluids. Return precautions given. Caregiver expresses understanding and agrees to plan.   Final Clinical Impressions(s) / UC Diagnoses   Final diagnoses:  Acute non-recurrent pansinusitis    ED Discharge Orders        Ordered    amoxicillin-clavulanate (AUGMENTIN) 875-125 MG tablet  Every 12 hours     11/04/17 1224    fluticasone (FLONASE) 50 MCG/ACT nasal spray  Daily     11/04/17 1224    ipratropium (ATROVENT) 0.06 % nasal spray  4 times daily     11/04/17 1224    cetirizine (ZYRTEC) 10 MG tablet  Daily     11/04/17 1225        Belinda Fisher, PA-C 11/04/17 1230

## 2017-11-04 NOTE — ED Triage Notes (Signed)
Pt here with care giver c/o congestion; pt autistic

## 2018-02-05 ENCOUNTER — Encounter (HOSPITAL_COMMUNITY): Payer: Self-pay

## 2018-02-05 ENCOUNTER — Ambulatory Visit (HOSPITAL_COMMUNITY)
Admission: EM | Admit: 2018-02-05 | Discharge: 2018-02-05 | Disposition: A | Discharge status: Home / Self Care | Payer: Medicaid Other | Attending: Family Medicine | Admitting: Family Medicine

## 2018-02-05 DIAGNOSIS — H5789 Other specified disorders of eye and adnexa: Secondary | ICD-10-CM | POA: Diagnosis not present

## 2018-02-05 MED ORDER — OLOPATADINE HCL 0.2 % OP SOLN: 1.0000 [drp] | Freq: Every day | OPHTHALMIC | 0 refills | Status: DC

## 2018-02-05 NOTE — ED Provider Notes (Signed)
[facility]    CSN: [id number] Arrival date & time: 02/05/18  1317     History   Chief Complaint Chief Complaint  Patient presents with  . Eye Drainage    HPI Glenn [name] is a 32 y.o. male.   32 year old male with history of autism, nonverbal, comes in with mother for 2-day history of eye drainage.  Mother states has had some drainage in the morning, no crusting of the eye.  No redness, and has not witness patient rubbing his eye.  No photophobia.  Unable to assess vision changes, but patient has not been squinting more, running into things.  Does not wear glasses or contacts.  Mother denies URI symptoms such as cough, congestion, sore throat.  No allergy symptoms such as sneezing.  Has not tried anything for the symptoms.     Past Medical History:  Diagnosis Date  . Autism   . Mental retardation     There are no active problems to display for this patient.   History reviewed. No pertinent surgical history.     Home Medications    Prior to Admission medications   Medication Sig Start Date End Date Taking? Authorizing Provider  divalproex (DEPAKOTE) 500 MG DR tablet Take 500 mg by mouth 3 (three) times daily.   Yes [provider]  haloperidol (HALDOL) 5 MG tablet Take 5 mg by mouth 2 (two) times daily.   Yes [provider]  LORazepam (ATIVAN) 0.5 MG tablet Take 0.5 mg by mouth every 8 (eight) hours.   Yes [provider]  QUEtiapine Fumarate (SEROQUEL PO) Take by mouth.   Yes [provider]  risperiDONE (RISPERDAL) 2 MG tablet Take 2 mg by mouth 3 (three) times daily.   Yes [provider]  zolpidem (AMBIEN) 5 MG tablet Take 1 tablet (5 mg total) by mouth at bedtime as needed for sleep. 06/13/12  Yes Dione Booze, MD  Olopatadine HCl 0.2 % SOLN Apply 1 drop to eye daily. 02/05/18   Cathie Hoops, Amy V, PA-C  zolpidem (AMBIEN) 5 MG tablet Take 5 mg by mouth at bedtime as needed. For sleep    [provider]     Family History Family History  Problem Relation Age of Onset  . Asthma Mother   . Hypertension Mother   . Healthy Father     Social History Social History   Tobacco Use  . Smoking status: Never Smoker  . Smokeless tobacco: Never Used  Substance Use Topics  . Alcohol use: No  . Drug use: No     Allergies   Patient has no known allergies.   Review of Systems Review of Systems  Reason unable to perform ROS: See HPI as above.     Physical Exam Triage Vital Signs ED Triage Vitals [02/05/18 1428]  Enc Vitals Group     BP      Pulse Rate (!) 122     Resp (!) 24     Temp (!) 97.2 F (36.2 C)     Temp Source Temporal     SpO2 97 %     Weight      Height      Head Circumference      Peak Flow      Pain Score      Pain Loc      Pain Edu?      Excl. in GC?    No data found.  Updated Vital Signs Pulse (!) 122  Temp (!) 97.2 F (36.2 C) (Temporal)   Resp (!) 24   SpO2 97%   Visual Acuity (unable to assess due to patient's cooperation.  Right Eye Distance:   Left Eye Distance:   Bilateral Distance:    Right Eye Near:   Left Eye Near:    Bilateral Near:     Physical Exam  Constitutional: He is oriented to person, place, and time. He appears well-developed and well-nourished. No distress.  HENT:  Head: Normocephalic and atraumatic.  Right Ear: Tympanic membrane, external ear and ear canal normal. Tympanic membrane is not erythematous and not bulging.  Left Ear: Tympanic membrane, external ear and ear canal normal. Tympanic membrane is not erythematous and not bulging.  Nose: Nose normal. Right sinus exhibits no maxillary sinus tenderness and no frontal sinus tenderness. Left sinus exhibits no maxillary sinus tenderness and no frontal sinus tenderness.  Mouth/Throat: Uvula is midline, oropharynx is clear and moist and mucous membranes are normal.  Eyes: Pupils are equal, round, and reactive to light. Conjunctivae, EOM and lids are normal.  No  photophobia on exam.   Neck: Normal range of motion. Neck supple.  Cardiovascular: Regular rhythm and normal heart sounds. Tachycardia present. Exam reveals no gallop and no friction rub.  No murmur heard. Pulmonary/Chest: Effort normal and breath sounds normal. He has no decreased breath sounds. He has no wheezes. He has no rhonchi. He has no rales.  Lymphadenopathy:    He has no cervical adenopathy.  Neurological: He is alert and oriented to person, place, and time.  Skin: Skin is warm and dry.  Psychiatric: He has a normal mood and affect. His behavior is normal. Judgment normal.     UC Treatments / Results  Labs (all labs ordered are listed, but only abnormal results are displayed) Labs Reviewed - No data to display  EKG None  Radiology No results found.  Procedures Procedures (including critical care time)  Medications Ordered in UC Medications - No data to display  Initial Impression / Assessment and Plan / UC Course  I have reviewed the triage vital signs and the nursing notes.  Pertinent labs & imaging results that were available during my care of the patient were reviewed by me and considered in my medical decision making (see chart for details).    No alarming signs on exam. Unable to get BP due to patient's cooperation. Patient with tachycardia on exam, mother thinks more due to being here, and willing to check at home and monitor for continued tachycardia. Patient is sitting and smiling without acute distress, can ambulate on own without difficulty or hesitancy. Eye exam normal. No signs of conjunctivitis. Mother would like to try eye drop for allergies. Start pataday as directed. Warm compress as tolerated. Return precautions given. Mother expresses understanding and agrees to plan.  Final Clinical Impressions(s) / UC Diagnoses   Final diagnoses:  Eye drainage    ED Prescriptions    Medication Sig Dispense Auth. Provider   Olopatadine HCl 0.2 % SOLN Apply 1  drop to eye daily. 2.5 mL Threasa AlphaYu, Amy V, PA-C        Yu, Amy V, [place]-C 02/05/18 1451

## 2018-02-05 NOTE — ED Triage Notes (Signed)
Pt presents with complaints of eye drainage since Monday. Pt is nonverbal and autistic. Unable to assess vital signs on patient due to refusal

## 2018-02-05 NOTE — Discharge Instructions (Signed)
No signs of eye infection. As discussed, this could be eye irritation, dry eyes, allergies. You can try pataday for allergies. Over the counter systane eye drops can help with dry eyes. If he allows, warm eye compress can help. Monitor for any worsening of symptoms, changes in vision, sensitivity to light, eye swelling, painful eye movement, follow up with ophthalmology for further evaluation.

## 2018-04-22 ENCOUNTER — Ambulatory Visit (HOSPITAL_COMMUNITY)
Admission: EM | Admit: 2018-04-22 | Discharge: 2018-04-22 | Disposition: A | Discharge status: Home / Self Care | Payer: Medicaid Other | Attending: Family Medicine | Admitting: Family Medicine

## 2018-04-22 ENCOUNTER — Encounter (HOSPITAL_COMMUNITY): Payer: Self-pay | Admitting: Emergency Medicine

## 2018-04-22 ENCOUNTER — Other Ambulatory Visit: Payer: Self-pay

## 2018-04-22 DIAGNOSIS — J3089 Other allergic rhinitis: Secondary | ICD-10-CM | POA: Diagnosis not present

## 2018-04-22 MED ORDER — CETIRIZINE HCL 10 MG PO TABS: 10.0000 mg | ORAL_TABLET | Freq: Every day | ORAL | 0 refills | Status: DC

## 2018-04-22 NOTE — Discharge Instructions (Signed)
History and exam not consistent with pink eye. Drainage could be from all the nasal congestion/drainage. Start zyrtec as directed. Bulb syringe to remove drainage. Keep hydrated, your urine should be clear to pale yellow in color. Monitor for worsening symptoms, follow up for reevaluation needed.

## 2018-04-22 NOTE — ED Provider Notes (Signed)
[facility]    CSN: [id number] Arrival date & time: 04/22/18  1103     History   Chief Complaint Chief Complaint  Patient presents with  . Conjunctivitis    bilatera    HPI Glenn [name] is a 32 y.o. male.   32 year old male with autism, nonverbal, comes in with mother for 2-day history of URI symptoms.  Has had mild cough, rhinorrhea, nasal congestion, bilateral eye drainage.  Mild eye redness.  No obvious signs of photophobia, vision changes.  Denies fever, chills, night sweats.  Patient has been sneezing as well.  Still eating and drinking without difficulty.  No obvious sick contact.  Up-to-date on immunizations.     Past Medical History:  Diagnosis Date  . Autism   . Mental retardation     There are no active problems to display for this patient.   History reviewed. No pertinent surgical history.     Home Medications    Prior to Admission medications   Medication Sig Start Date End Date Taking? Authorizing Provider  divalproex (DEPAKOTE) 500 MG DR tablet Take 500 mg by mouth 3 (three) times daily.   Yes [provider]  haloperidol (HALDOL) 5 MG tablet Take 5 mg by mouth 2 (two) times daily.   Yes [provider]  LORazepam (ATIVAN) 0.5 MG tablet Take 0.5 mg by mouth every 8 (eight) hours.   Yes [provider]  QUEtiapine Fumarate (SEROQUEL PO) Take by mouth.   Yes [provider]  risperiDONE (RISPERDAL) 2 MG tablet Take 2 mg by mouth 3 (three) times daily.   Yes [provider]  cetirizine (ZYRTEC) 10 MG tablet Take 1 tablet (10 mg total) by mouth daily. 04/22/18   Cathie Hoops, Mileydi Milsap V, PA-C  Olopatadine HCl 0.2 % SOLN Apply 1 drop to eye daily. 02/05/18   Cathie Hoops, Talyn Dessert V, PA-C  zolpidem (AMBIEN) 5 MG tablet Take 5 mg by mouth at bedtime as needed. For sleep    [provider]  zolpidem (AMBIEN) 5 MG tablet Take 1 tablet (5 mg total) by mouth at bedtime as needed for sleep. 06/13/12   Dione Booze, MD     Family History Family History  Problem Relation Age of Onset  . Asthma Mother   . Hypertension Mother   . Healthy Father     Social History Social History   Tobacco Use  . Smoking status: Never Smoker  . Smokeless tobacco: Never Used  Substance Use Topics  . Alcohol use: No  . Drug use: No     Allergies   Patient has no known allergies.   Review of Systems Review of Systems  Reason unable to perform ROS: See HPI as above.     Physical Exam Triage Vital Signs ED Triage Vitals [04/22/18 1140]  Enc Vitals Group     BP      Pulse Rate (!) 118     Resp      Temp (!) 97.1 F (36.2 C)     Temp Source Oral     SpO2 99 %     Weight      Height      Head Circumference      Peak Flow      Pain Score      Pain Loc      Pain Edu?      Excl. in GC?    No data found.  Updated Vital Signs Pulse (!) 118   Temp (!)  97.1 F (36.2 C) (Oral)   SpO2 99%   Physical Exam  Constitutional: He is oriented to person, place, and time. He appears well-developed and well-nourished. No distress.  HENT:  Head: Normocephalic and atraumatic.  Right Ear: Tympanic membrane, external ear and ear canal normal. Tympanic membrane is not erythematous and not bulging.  Left Ear: Tympanic membrane, external ear and ear canal normal. Tympanic membrane is not erythematous and not bulging.  Nose: Mucosal edema and rhinorrhea present. Right sinus exhibits no maxillary sinus tenderness and no frontal sinus tenderness. Left sinus exhibits no maxillary sinus tenderness and no frontal sinus tenderness.  Mouth/Throat: Uvula is midline, oropharynx is clear and moist and mucous membranes are normal.  Eyes: Pupils are equal, round, and reactive to light. Conjunctivae and EOM are normal.  Neck: Normal range of motion. Neck supple.  Cardiovascular: Normal rate, regular rhythm and normal heart sounds. Exam reveals no gallop and no friction rub.  No murmur heard. Pulmonary/Chest: Effort normal and  breath sounds normal. He has no decreased breath sounds. He has no wheezes. He has no rhonchi. He has no rales.  Lymphadenopathy:    He has no cervical adenopathy.  Neurological: He is alert and oriented to person, place, and time.  Skin: Skin is warm and dry.  Psychiatric: He has a normal mood and affect. His behavior is normal. Judgment normal.     UC Treatments / Results  Labs (all labs ordered are listed, but only abnormal results are displayed) Labs Reviewed - No data to display  EKG None  Radiology No results found.  Procedures Procedures (including critical care time)  Medications Ordered in UC Medications - No data to display  Initial Impression / Assessment and Plan / UC Course  I have reviewed the triage vital signs and the nursing notes.  Pertinent labs & imaging results that were available during my care of the patient were reviewed by me and considered in my medical decision making (see chart for details).    Patient without tachycardia on exam. He is nontoxic in appearance, smiling on exam. Discussed with mother, symptoms less concerning for bacterial conjunctivitis. Discussed possible allergic rhinitis vs viral URI. Will start patient on zyrtec and use bulb syringe. Push fluids. Return precautions given.  Final Clinical Impressions(s) / UC Diagnoses   Final diagnoses:  Non-seasonal allergic rhinitis due to other allergic trigger    ED Prescriptions    Medication Sig Dispense Auth. Provider   cetirizine (ZYRTEC) 10 MG tablet Take 1 tablet (10 mg total) by mouth daily. 15 tablet Threasa Alpha, [place] 04/22/18 1226

## 2018-04-22 NOTE — ED Triage Notes (Signed)
Pt has had drainage from both eyes x2 days.  Pt is nonverbal and autistic.

## 2018-06-12 ENCOUNTER — Other Ambulatory Visit: Payer: Self-pay

## 2018-06-12 ENCOUNTER — Encounter (HOSPITAL_COMMUNITY): Payer: Self-pay | Admitting: *Deleted

## 2018-06-12 ENCOUNTER — Ambulatory Visit (HOSPITAL_COMMUNITY)
Admission: EM | Admit: 2018-06-12 | Discharge: 2018-06-12 | Disposition: A | Discharge status: Home / Self Care | Payer: Medicaid Other | Attending: Family Medicine | Admitting: Family Medicine

## 2018-06-12 DIAGNOSIS — H5789 Other specified disorders of eye and adnexa: Secondary | ICD-10-CM

## 2018-06-12 MED ORDER — OLOPATADINE HCL 0.2 % OP SOLN: 1.0000 [drp] | Freq: Every day | OPHTHALMIC | 0 refills | Status: DC

## 2018-06-12 NOTE — ED Provider Notes (Signed)
  [facility]   [id number] 06/12/18 Arrival Time: 1527  ASSESSMENT & PLAN:  1. Eye irritation    No sign of infection. Discussed.  Meds ordered this encounter  Medications  . Olopatadine HCl 0.2 % SOLN    Sig: Apply 1 drop to eye daily.    Dispense:  2.5 mL    Refill:  0    School/daycare note written. Ophthalmic drops per orders.  Reviewed expectations re: course of current medical issues. Questions answered. Outlined signs and symptoms indicating need for more acute intervention. Patient verbalized understanding. After Visit Summary given.   SUBJECTIVE:  Glenn [name] is a 32 y.o. male who is brought in by his mother. She reports school thought he may have pinkeye. She has not noticed any eye symptoms. No recent illnesses. Afebrile. No new exposures to chemicals/substances. Injury: no. Visual changes: none reported (he is autistic). Contact lens use: no. Self treatment: none reported.  ROS: As per HPI.  OBJECTIVE:  Vitals:   06/12/18 1601  Pulse: 95  Resp: 18  SpO2: 99%    General appearance: alert; no distress Eyes: conjunctiva: clear bilaterally; EOMI; lids normal Neck: supple Lungs: clear to auscultation bilaterally Heart: regular rate and rhythm Skin: warm and dry Psychological: alert and cooperative; normal mood and affect  No Known Allergies  Past Medical History:  Diagnosis Date  . Autism   . Mental retardation    Social History   Socioeconomic History  . Marital status: Single    Spouse name: Not on file  . Number of children: Not on file  . Years of education: Not on file  . Highest education level: Not on file  Occupational History  . Not on file  Social Needs  . Financial resource strain: Not on file  . Food insecurity:    Worry: Not on file    Inability: Not on file  . Transportation needs:    Medical: Not on file    Non-medical: Not on file  Tobacco Use  . Smoking status: Never Smoker  . Smokeless tobacco:  Never Used  Substance and Sexual Activity  . Alcohol use: No  . Drug use: No  . Sexual activity: Never  Lifestyle  . Physical activity:    Days per week: Not on file    Minutes per session: Not on file  . Stress: Not on file  Relationships  . Social connections:    Talks on phone: Not on file    Gets together: Not on file    Attends religious service: Not on file    Active member of club or organization: Not on file    Attends meetings of clubs or organizations: Not on file    Relationship status: Not on file  . Intimate partner violence:    Fear of current or ex partner: Not on file    Emotionally abused: Not on file    Physically abused: Not on file    Forced sexual activity: Not on file  Other Topics Concern  . Not on file  Social History Narrative  . Not on file   Family History  Problem Relation Age of Onset  . Asthma Mother   . Hypertension Mother   . Healthy Father    History reviewed. No pertinent surgical history.   Mardella Layman, MD 06/18/18 [phone]

## 2018-06-12 NOTE — ED Triage Notes (Signed)
Mother states child was told by his school  That they thought he had pink eye and he needs to be checked. Patient is autistic And will only let you do limited VS.

## 2018-06-27 ENCOUNTER — Inpatient Hospital Stay (HOSPITAL_COMMUNITY)
Admission: EM | Admit: 2018-06-27 | Discharge: 2018-07-02 | DRG: 871 | Disposition: A | Discharge status: Home Health | Payer: Medicaid Other | Attending: Internal Medicine | Admitting: Internal Medicine

## 2018-06-27 ENCOUNTER — Emergency Department (HOSPITAL_COMMUNITY): Payer: Medicaid Other

## 2018-06-27 ENCOUNTER — Encounter (HOSPITAL_COMMUNITY): Payer: Self-pay

## 2018-06-27 ENCOUNTER — Other Ambulatory Visit: Payer: Self-pay

## 2018-06-27 DIAGNOSIS — E8770 Fluid overload, unspecified: Secondary | ICD-10-CM | POA: Diagnosis present

## 2018-06-27 DIAGNOSIS — A419 Sepsis, unspecified organism: Principal | ICD-10-CM | POA: Diagnosis present

## 2018-06-27 DIAGNOSIS — R509 Fever, unspecified: Secondary | ICD-10-CM

## 2018-06-27 DIAGNOSIS — F840 Autistic disorder: Secondary | ICD-10-CM | POA: Diagnosis present

## 2018-06-27 DIAGNOSIS — Z79899 Other long term (current) drug therapy: Secondary | ICD-10-CM

## 2018-06-27 DIAGNOSIS — J9601 Acute respiratory failure with hypoxia: Secondary | ICD-10-CM | POA: Diagnosis present

## 2018-06-27 DIAGNOSIS — N179 Acute kidney failure, unspecified: Secondary | ICD-10-CM | POA: Diagnosis present

## 2018-06-27 DIAGNOSIS — F72 Severe intellectual disabilities: Secondary | ICD-10-CM | POA: Diagnosis present

## 2018-06-27 DIAGNOSIS — R6520 Severe sepsis without septic shock: Secondary | ICD-10-CM | POA: Diagnosis present

## 2018-06-27 DIAGNOSIS — J189 Pneumonia, unspecified organism: Secondary | ICD-10-CM | POA: Diagnosis present

## 2018-06-27 DIAGNOSIS — E669 Obesity, unspecified: Secondary | ICD-10-CM | POA: Diagnosis present

## 2018-06-27 DIAGNOSIS — Z6841 Body Mass Index (BMI) 40.0 and over, adult: Secondary | ICD-10-CM

## 2018-06-27 DIAGNOSIS — J069 Acute upper respiratory infection, unspecified: Secondary | ICD-10-CM | POA: Diagnosis present

## 2018-06-27 DIAGNOSIS — N10 Acute tubulo-interstitial nephritis: Secondary | ICD-10-CM | POA: Diagnosis present

## 2018-06-27 DIAGNOSIS — R0902 Hypoxemia: Secondary | ICD-10-CM

## 2018-06-27 DIAGNOSIS — F79 Unspecified intellectual disabilities: Secondary | ICD-10-CM | POA: Diagnosis present

## 2018-06-27 DIAGNOSIS — N289 Disorder of kidney and ureter, unspecified: Secondary | ICD-10-CM

## 2018-06-27 DIAGNOSIS — R739 Hyperglycemia, unspecified: Secondary | ICD-10-CM | POA: Diagnosis present

## 2018-06-27 LAB — COMPREHENSIVE METABOLIC PANEL
ALT: 32 U/L (ref 0–44)
AST: 37 U/L (ref 15–41)
Albumin: 3 g/dL — ABNORMAL LOW (ref 3.5–5.0)
Alkaline Phosphatase: 46 U/L (ref 38–126)
Anion gap: 14 (ref 5–15)
BUN: 11 mg/dL (ref 6–20)
CHLORIDE: 96 mmol/L — AB (ref 98–111)
CO2: 21 mmol/L — AB (ref 22–32)
CREATININE: 1.54 mg/dL — AB (ref 0.61–1.24)
Calcium: 8.7 mg/dL — ABNORMAL LOW (ref 8.9–10.3)
GFR calc non Af Amer: 59 mL/min — ABNORMAL LOW (ref >60)
Glucose, Bld: 110 mg/dL — ABNORMAL HIGH (ref 70–99)
Potassium: 3.7 mmol/L (ref 3.5–5.1)
SODIUM: 131 mmol/L — AB (ref 135–145)
Total Bilirubin: 0.7 mg/dL (ref 0.3–1.2)
Total Protein: 7.2 g/dL (ref 6.5–8.1)

## 2018-06-27 LAB — CBC WITH DIFFERENTIAL/PLATELET
ABS IMMATURE GRANULOCYTES: 0.04 10*3/uL (ref 0.00–0.07)
BASOS ABS: 0 10*3/uL (ref 0.0–0.1)
Basophils Relative: 0 %
EOS PCT: 0 %
Eosinophils Absolute: 0 10*3/uL (ref 0.0–0.5)
HEMATOCRIT: 33.7 % — AB (ref 39.0–52.0)
HEMOGLOBIN: 10.9 g/dL — AB (ref 13.0–17.0)
IMMATURE GRANULOCYTES: 0 %
LYMPHS ABS: 1.7 10*3/uL (ref 0.7–4.0)
LYMPHS PCT: 15 %
MCH: 27.7 pg (ref 26.0–34.0)
MCHC: 32.3 g/dL (ref 30.0–36.0)
MCV: 85.8 fL (ref 80.0–100.0)
Monocytes Absolute: 1.5 10*3/uL — ABNORMAL HIGH (ref 0.1–1.0)
Monocytes Relative: 13 %
NEUTROS PCT: 72 %
Neutro Abs: 7.8 10*3/uL — ABNORMAL HIGH (ref 1.7–7.7)
Platelets: 240 10*3/uL (ref 150–400)
RBC: 3.93 MIL/uL — ABNORMAL LOW (ref 4.22–5.81)
RDW: 14.2 % (ref 11.5–15.5)
WBC: 11 10*3/uL — ABNORMAL HIGH (ref 4.0–10.5)
nRBC: 0 % (ref 0.0–0.2)

## 2018-06-27 LAB — I-STAT CG4 LACTIC ACID, ED: LACTIC ACID, VENOUS: 0.94 mmol/L (ref 0.5–1.9)

## 2018-06-27 LAB — ETHANOL

## 2018-06-27 LAB — INFLUENZA PANEL BY PCR (TYPE A & B)
INFLAPCR: NEGATIVE
INFLBPCR: NEGATIVE

## 2018-06-27 LAB — VALPROIC ACID LEVEL: Valproic Acid Lvl: 57 ug/mL (ref 50.0–100.0)

## 2018-06-27 MED ORDER — SODIUM CHLORIDE 0.9 % IV SOLN
1000.0000 mL | INTRAVENOUS | Status: DC
  Administered 2018-06-27 – 2018-06-28 (×2): 1000 mL via INTRAVENOUS

## 2018-06-27 MED ORDER — SODIUM CHLORIDE 0.9 % IV SOLN
2.0000 g | Freq: Once | INTRAVENOUS | Status: AC
  Administered 2018-06-27: 2 g via INTRAVENOUS
  Filled 2018-06-27: qty 2

## 2018-06-27 MED ORDER — SODIUM CHLORIDE 0.9 % IV SOLN
INTRAVENOUS | Status: DC | PRN
  Administered 2018-06-27: 500 mL via INTRAVENOUS

## 2018-06-27 MED ORDER — IOHEXOL 300 MG/ML  SOLN
125.0000 mL | Freq: Once | INTRAMUSCULAR | Status: AC | PRN
  Administered 2018-06-27: 125 mL via INTRAVENOUS

## 2018-06-27 MED ORDER — LORAZEPAM 2 MG/ML IJ SOLN: 1.0000 mg | Freq: Once | INTRAMUSCULAR | Status: DC

## 2018-06-27 MED ORDER — ACETAMINOPHEN 500 MG PO TABS
1000.0000 mg | ORAL_TABLET | Freq: Once | ORAL | Status: AC
  Administered 2018-06-27: 1000 mg via ORAL
  Filled 2018-06-27: qty 2

## 2018-06-27 MED ORDER — LORAZEPAM 2 MG/ML IJ SOLN
1.0000 mg | Freq: Once | INTRAMUSCULAR | Status: AC
  Administered 2018-06-27: 1 mg via INTRAVENOUS
  Filled 2018-06-27: qty 1

## 2018-06-27 MED ORDER — ONDANSETRON HCL 4 MG PO TABS: 4.0000 mg | ORAL_TABLET | Freq: Four times a day (QID) | ORAL | Status: DC | PRN

## 2018-06-27 MED ORDER — VANCOMYCIN HCL IN DEXTROSE 1-5 GM/200ML-% IV SOLN: 1000.0000 mg | Freq: Once | INTRAVENOUS | Status: DC

## 2018-06-27 MED ORDER — LORAZEPAM 2 MG/ML IJ SOLN
1.0000 mg | INTRAMUSCULAR | Status: DC | PRN
  Administered 2018-06-28: 1 mg via INTRAVENOUS
  Filled 2018-06-27: qty 1

## 2018-06-27 MED ORDER — SODIUM CHLORIDE 0.9 % IV SOLN
1000.0000 mL | INTRAVENOUS | Status: DC
  Administered 2018-06-27: 1000 mL via INTRAVENOUS

## 2018-06-27 MED ORDER — ONDANSETRON HCL 4 MG/2ML IJ SOLN: 4.0000 mg | Freq: Four times a day (QID) | INTRAMUSCULAR | Status: DC | PRN

## 2018-06-27 MED ORDER — VANCOMYCIN HCL 10 G IV SOLR
2500.0000 mg | Freq: Once | INTRAVENOUS | Status: AC
  Administered 2018-06-27: 2500 mg via INTRAVENOUS
  Filled 2018-06-27: qty 2500

## 2018-06-27 MED ORDER — SODIUM CHLORIDE 0.9 % IV BOLUS
1000.0000 mL | Freq: Once | INTRAVENOUS | Status: AC
  Administered 2018-06-27: 1000 mL via INTRAVENOUS

## 2018-06-27 MED ORDER — ACETAMINOPHEN 650 MG RE SUPP: 650.0000 mg | Freq: Four times a day (QID) | RECTAL | Status: DC | PRN

## 2018-06-27 MED ORDER — ENOXAPARIN SODIUM 40 MG/0.4ML ~~LOC~~ SOLN
40.0000 mg | SUBCUTANEOUS | Status: DC
  Administered 2018-06-28 – 2018-06-30 (×3): 40 mg via SUBCUTANEOUS
  Filled 2018-06-27 (×3): qty 0.4

## 2018-06-27 MED ORDER — SODIUM CHLORIDE 0.9 % IV SOLN
2.0000 g | Freq: Two times a day (BID) | INTRAVENOUS | Status: DC
  Administered 2018-06-28 – 2018-07-01 (×7): 2 g via INTRAVENOUS
  Filled 2018-06-27 (×9): qty 2

## 2018-06-27 MED ORDER — SODIUM CHLORIDE 0.9 % IV SOLN
INTRAVENOUS | Status: DC | PRN
  Administered 2018-06-27 (×2): 500 mL via INTRAVENOUS

## 2018-06-27 MED ORDER — LORAZEPAM 1 MG PO TABS
2.0000 mg | ORAL_TABLET | Freq: Once | ORAL | Status: AC
  Administered 2018-06-27: 2 mg via ORAL
  Filled 2018-06-27: qty 2

## 2018-06-27 MED ORDER — VANCOMYCIN HCL 10 G IV SOLR
1250.0000 mg | Freq: Two times a day (BID) | INTRAVENOUS | Status: DC
  Administered 2018-06-28 – 2018-06-29 (×3): 1250 mg via INTRAVENOUS
  Filled 2018-06-27 (×3): qty 1250

## 2018-06-27 MED ORDER — METRONIDAZOLE IN NACL 5-0.79 MG/ML-% IV SOLN
500.0000 mg | Freq: Three times a day (TID) | INTRAVENOUS | Status: DC
  Administered 2018-06-27 – 2018-06-28 (×2): 500 mg via INTRAVENOUS
  Filled 2018-06-27 (×2): qty 100

## 2018-06-27 MED ORDER — ACETAMINOPHEN 325 MG PO TABS
650.0000 mg | ORAL_TABLET | Freq: Four times a day (QID) | ORAL | Status: DC | PRN
  Administered 2018-06-28 – 2018-06-29 (×5): 650 mg via ORAL
  Filled 2018-06-27 (×5): qty 2

## 2018-06-27 NOTE — Progress Notes (Addendum)
Pharmacy Antibiotic Note  Glenn [name] is a 32 y.o. male admitted on 06/27/2018 with sepsis of unknown source.  Pharmacy has been consulted for vancomycin and cefepime dosing.  Currently, febrile (Tmax 103.2). Patient refusing labs.   Plan: Cefepime 2 gm IV once Vancomycin 2500 mg IV once Metronidazole 500 mg IV q8h per MD F/u labs for maintenance dosing  Addendum: Labs drawn. Scr 1.54 mg/dL; estimated normalized CrCl 70.8 mL/min.  Plan: Cefepime 2 gm IV q12h Vancomycin 1250 mg IV q12h Monitor renal function and vancomycin trough at steady-state F/u cultures and LOT  Height: 6' (182.9 cm) Weight: (!) 320 lb (145.2 kg) IBW/kg (Calculated) : 77.6  Temp (24hrs), Avg:102.9 F (39.4 C), Min:102.5 F (39.2 C), Max:103.2 F (39.6 C)  Recent Labs  Lab 06/27/18 1651 06/27/18 1658  WBC 11.0*  --   CREATININE 1.54*  --   LATICACIDVEN  --  0.94    Estimated Creatinine Clearance: 102.8 mL/min (A) (by C-G formula based on SCr of 1.54 mg/dL (H)).    No Known Allergies  Antimicrobials this admission: Cefepime 11/15>> Vancomycin 11/15>> Metronidazole 11/15>>  Microbiology results: 11/15 BCx:  Thank you for allowing pharmacy to be a part of this patient's care.  Chauncey Mannebecca Kamaree Berkel, Pharmacy Student

## 2018-06-27 NOTE — ED Triage Notes (Signed)
Pt arrived with mother who reports that the school told her that his HR was 130. Mom also reports that he has "had the shakes". Pt is autistic and nonverbal.

## 2018-06-27 NOTE — Progress Notes (Signed)
Pharmacy Antibiotic Note  Glenn DawleyBrandon C [name] is a 32 y.o. male admitted on 06/27/2018 with sepsis of unknown source.  Pharmacy has been consulted for vancomycin and cefepime dosing.  Currently, febrile (Tmax 103.2). Patient refusing labs.   Plan: Cefepime 2 gm IV once Vancomycin 2500 mg IV once Metronidazole 500 mg IV q8h per MD F/u labs for maintenance dosing   Height: 6' (182.9 cm) Weight: (!) 320 lb (145.2 kg) IBW/kg (Calculated) : 77.6  Temp (24hrs), Avg:103.2 F (39.6 C), Min:103.2 F (39.6 C), Max:103.2 F (39.6 C)  No results for input(s): WBC, CREATININE, LATICACIDVEN, VANCOTROUGH, VANCOPEAK, VANCORANDOM, GENTTROUGH, GENTPEAK, GENTRANDOM, TOBRATROUGH, TOBRAPEAK, TOBRARND, AMIKACINPEAK, AMIKACINTROU, AMIKACIN in the last 168 hours.  CrCl cannot be calculated (No successful lab value found.).    No Known Allergies  Antimicrobials this admission: Cefepime 11/15>> Vancomycin 11/15>> Metronidazole 11/15>>  Microbiology results: 11/15 BCx:  Thank you for allowing pharmacy to be a part of this patient's care.  Chauncey Mannebecca Celestine Prim, Pharmacy Student

## 2018-06-27 NOTE — H&P (Addendum)
History and Physical    Glenn [name] AVW:[id number] DOB: 12/21/1985 DOA: 06/27/2018  PCP: [facility], [facility]  Patient coming from: Home  I have personally briefly reviewed [facility]  Chief Complaint: Rigors  HPI: Glenn [name] is a 32 y.o. 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  Patient apparently with a couple day history of diarrhea and cough.  School noted patient had rigors and HR 130 today.  Sent in to ED.  Sounds like he may have been having some fever, diarrhea, cough symptoms for past 2-3 days per family.   ED Course: Tm 103.x, Tachy 120 after 2L NS bolus, lactate 0.94, WBC 11k.  CXR neg, UA still pending.  Tachypnea 35-40, O2 sat 92% RA.  Fever persists despite 2 rounds of 1gm tylenol x4 hours apart.   Review of Systems: Unable to perform, patient non-verbal. Past Medical History:  Diagnosis Date  . Autism   . Mental retardation     History reviewed. No pertinent surgical history.   reports that he has never smoked. He has never used smokeless tobacco. He reports that he does not drink alcohol or use drugs.  No Known Allergies  Family History  Problem Relation Age of Onset  . Asthma Mother   . Hypertension Mother   . Healthy Father      Prior to Admission medications   Medication Sig Start Date End Date Taking? Authorizing Provider  cetirizine (ZYRTEC) 10 MG tablet Take 1 tablet (10 mg total) by mouth daily. 04/22/18   Cathie HoopsYu, Amy V, PA-C  divalproex (DEPAKOTE) 500 MG DR tablet Take 500 mg by mouth 3 (three) times daily.    [provider]  haloperidol (HALDOL) 5 MG tablet Take 5 mg by mouth 2 (two) times daily.    [provider]  LORazepam (ATIVAN) 0.5 MG tablet Take 0.5 mg by mouth every 8 (eight) hours.    [provider]  Olopatadine HCl 0.2 % SOLN Apply 1 drop to eye daily. 06/12/18   Mardella LaymanHagler, Brian, MD  QUEtiapine Fumarate (SEROQUEL PO) Take by  mouth.    [provider]  risperiDONE (RISPERDAL) 2 MG tablet Take 2 mg by mouth 3 (three) times daily.    [provider]  zolpidem (AMBIEN) 5 MG tablet Take 5 mg by mouth at bedtime as needed. For sleep    [provider]  zolpidem (AMBIEN) 5 MG tablet Take 1 tablet (5 mg total) by mouth at bedtime as needed for sleep. 06/13/12   Dione BoozeGlick, David, MD    Physical Exam: Vitals:   06/27/18 1845 06/27/18 1855 06/27/18 1858 06/27/18 2118  BP: (!) 142/69     Pulse: (!) 31 (!) 115    Resp: (!) 31 (!) 32    Temp:   (!) 102.5 F (39.2 C) (!) 103 F (39.4 C)  TempSrc:   Oral Oral  SpO2:  100%    Weight:      Height:        Constitutional: Toxic appearing Eyes: PERRL, lids and conjunctivae normal ENMT: Mucous membranes are moist. Posterior pharynx clear of any exudate or lesions.Normal dentition.  Neck: normal, supple, no masses, no thyromegaly, No meningismus Respiratory: Tachypnea Cardiovascular: Tachycardia Abdomen: no tenderness, no masses palpated. No hepatosplenomegaly. Bowel sounds positive.  Musculoskeletal: no clubbing / cyanosis. No joint deformity upper and lower extremities. Good ROM, no contractures. Normal muscle tone.  Skin: no rashes, lesions, ulcers. No  induration Neurologic: CN 2-12 grossly intact. Sensation intact, DTR normal. Strength 5/5 in all 4.  Psychiatric: Non-verbal autism    Labs on Admission: I have personally reviewed following labs and imaging studies  CBC: Recent Labs  Lab 06/27/18 1651  WBC 11.0*  NEUTROABS 7.8*  HGB 10.9*  HCT 33.7*  MCV 85.8  PLT 240   Basic Metabolic Panel: Recent Labs  Lab 06/27/18 1651  NA 131*  K 3.7  CL 96*  CO2 21*  GLUCOSE 110*  BUN 11  CREATININE 1.54*  CALCIUM 8.7*   GFR: Estimated Creatinine Clearance: 102.8 mL/min (A) (by C-G formula based on SCr of 1.54 mg/dL (H)). Liver Function Tests: Recent Labs  Lab 06/27/18 1651  AST 37  ALT 32  ALKPHOS 46  BILITOT 0.7  PROT 7.2    ALBUMIN 3.0*   No results for input(s): LIPASE, AMYLASE in the last 168 hours. No results for input(s): AMMONIA in the last 168 hours. Coagulation Profile: No results for input(s): INR, PROTIME in the last 168 hours. Cardiac Enzymes: No results for input(s): CKTOTAL, CKMB, CKMBINDEX, TROPONINI in the last 168 hours. BNP (last 3 results) No results for input(s): PROBNP in the last 8760 hours. HbA1C: No results for input(s): HGBA1C in the last 72 hours. CBG: No results for input(s): GLUCAP in the last 168 hours. Lipid Profile: No results for input(s): CHOL, HDL, LDLCALC, TRIG, CHOLHDL, LDLDIRECT in the last 72 hours. Thyroid Function Tests: No results for input(s): TSH, T4TOTAL, FREET4, T3FREE, THYROIDAB in the last 72 hours. Anemia Panel: No results for input(s): VITAMINB12, FOLATE, FERRITIN, TIBC, IRON, RETICCTPCT in the last 72 hours. Urine analysis:    Component Value Date/Time   COLORURINE YELLOW 09/16/2017 1715   APPEARANCEUR CLEAR 09/16/2017 1715   LABSPEC 1.017 09/16/2017 1715   PHURINE 5.0 09/16/2017 1715   GLUCOSEU NEGATIVE 09/16/2017 1715   HGBUR NEGATIVE 09/16/2017 1715   BILIRUBINUR NEGATIVE 09/16/2017 1715   KETONESUR NEGATIVE 09/16/2017 1715   PROTEINUR NEGATIVE 09/16/2017 1715   NITRITE NEGATIVE 09/16/2017 1715   LEUKOCYTESUR NEGATIVE 09/16/2017 1715    Radiological Exams on Admission: Dg Chest Port 1 View  Result Date: 06/27/2018 CLINICAL DATA:  Fever EXAM: PORTABLE CHEST 1 VIEW COMPARISON:  None. FINDINGS: Degree of inspiration is shallow. There is no appreciable edema or consolidation. There is mild bibasilar atelectasis. Heart is mildly enlarged with pulmonary vascularity normal. No adenopathy. No bone lesions. There is rightward deviation of the upper thoracic trachea. IMPRESSION: Shallow inspiration with bibasilar atelectasis. No frank consolidation. Heart prominent. Rightward deviation of the upper thoracic trachea. Question enlarged thyroid.  Electronically Signed   By: Bretta Bang III M.D.   On: 06/27/2018 16:23    EKG: Independently reviewed.  Assessment/Plan Principal Problem:   Sepsis (HCC) Active Problems:   Autism   Renal insufficiency    1. Sepsis - 1. IVF: 2L bolus and 150 cc/hr 2. BCx, UCx pending 3. Influenza pending 4. CT chest/abd/pelvis ordered and pending to try and get a better idea of source, r/o appendicitis, r/o pyohydronephrosis, second look for PNA 5. Empiric cefepime / flagyl / vanc for now 2. Autism - 1. Ativan PRN 2. Continue other home meds when we know dosing 3. Renal insufficiency - 1. Creat 1.5, no priors 2. IVF 3. Strict intake and output  DVT prophylaxis: Lovenox Code Status: Full Family Communication: Family at bedside Disposition Plan: Home after admit Consults called: None Admission status: Admit to inpatient  Severity of Illness: The appropriate patient status for  this patient is INPATIENT. Inpatient status is judged to be reasonable and necessary in order to provide the required intensity of service to ensure the patient's safety. The patient's presenting symptoms, physical exam findings, and initial radiographic and laboratory data in the context of their chronic comorbidities is felt to place them at high risk for further clinical deterioration. Furthermore, it is not anticipated that the patient will be medically stable for discharge from the hospital within 2 midnights of admission. The following factors support the patient status of inpatient.   " The patient's presenting symptoms include rigors, diarrhea, cough. " The worrisome physical exam findings include Tachycardia, tachypnea. " The initial radiographic and laboratory data are worrisome because of Tm 103, creat 1.5. " The chronic co-morbidities include MR and non-verbal Autism.   * I certify that at the point of admission it is my clinical judgment that the patient will require inpatient hospital care spanning  beyond 2 midnights from the point of admission due to high intensity of service, high risk for further deterioration and high frequency of surveillance required.Hillary Bow DO [facility] Pager [phone] Only works nights!  If 7AM-7PM, please contact the primary day team physician taking care of patient  [email or web address] Password [facility]  06/27/2018, 9:33 PM

## 2018-06-27 NOTE — ED Notes (Signed)
ED Provider at bedside. 

## 2018-06-27 NOTE — ED Notes (Signed)
Pt refusing IV access, unable to obtain, attempted by 2 RNs. MD made aware.

## 2018-06-27 NOTE — Progress Notes (Addendum)
CT scan showing pyelonephritis R>L.  No evidence of hydronephrosis or nephrolithiasis.  Still trying to get UA / UCx (initial specimen was contaminated).  Had more UOP recently but that all over bed.  Condom cath in place now.  Do not think he would tolerate a foley very well (was a struggle given autism just to get IV access).  Will check serial lactates.  Fever continues at 102-103 despite rounds of tylenol.  Hesitant to use NSAIDs however due to Creat 1.5.  3rd L bolus given.  Systolic's continue to be 110s though.

## 2018-06-27 NOTE — ED Notes (Signed)
Pt placed on 3L [place] in CT d/t sats dropping into the 80s

## 2018-06-28 LAB — URINALYSIS, ROUTINE W REFLEX MICROSCOPIC
BACTERIA UA: NONE SEEN
BILIRUBIN URINE: NEGATIVE
Glucose, UA: NEGATIVE mg/dL
KETONES UR: 5 mg/dL — AB
NITRITE: NEGATIVE
PH: 6 (ref 5.0–8.0)
Protein, ur: NEGATIVE mg/dL
SPECIFIC GRAVITY, URINE: 1.017 (ref 1.005–1.030)
WBC, UA: >50 WBC/hpf — ABNORMAL HIGH (ref 0–5)

## 2018-06-28 LAB — COMPREHENSIVE METABOLIC PANEL
ALT: 33 U/L (ref 0–44)
AST: 39 U/L (ref 15–41)
Albumin: 2.5 g/dL — ABNORMAL LOW (ref 3.5–5.0)
Alkaline Phosphatase: 48 U/L (ref 38–126)
Anion gap: 10 (ref 5–15)
BILIRUBIN TOTAL: 0.2 mg/dL — AB (ref 0.3–1.2)
BUN: 10 mg/dL (ref 6–20)
CALCIUM: 8.1 mg/dL — AB (ref 8.9–10.3)
CHLORIDE: 105 mmol/L (ref 98–111)
CO2: 21 mmol/L — ABNORMAL LOW (ref 22–32)
CREATININE: 0.65 mg/dL (ref 0.61–1.24)
Glucose, Bld: 45 mg/dL — ABNORMAL LOW (ref 70–99)
Potassium: 4.2 mmol/L (ref 3.5–5.1)
Sodium: 136 mmol/L (ref 135–145)
TOTAL PROTEIN: 6.5 g/dL (ref 6.5–8.1)

## 2018-06-28 LAB — CBC
HEMATOCRIT: 29.5 % — AB (ref 39.0–52.0)
Hemoglobin: 9.5 g/dL — ABNORMAL LOW (ref 13.0–17.0)
MCH: 27.5 pg (ref 26.0–34.0)
MCHC: 32.2 g/dL (ref 30.0–36.0)
MCV: 85.3 fL (ref 80.0–100.0)
PLATELETS: 252 10*3/uL (ref 150–400)
RBC: 3.46 MIL/uL — ABNORMAL LOW (ref 4.22–5.81)
RDW: 14.3 % (ref 11.5–15.5)
WBC: 10 10*3/uL (ref 4.0–10.5)
nRBC: 0 % (ref 0.0–0.2)

## 2018-06-28 LAB — CK: CK TOTAL: 80 U/L (ref 49–397)

## 2018-06-28 LAB — GLUCOSE, CAPILLARY: GLUCOSE-CAPILLARY: 163 mg/dL — AB (ref 70–99)

## 2018-06-28 LAB — MRSA PCR SCREENING: MRSA by PCR: NEGATIVE

## 2018-06-28 LAB — HIV ANTIBODY (ROUTINE TESTING W REFLEX): HIV SCREEN 4TH GENERATION: NONREACTIVE

## 2018-06-28 LAB — LACTIC ACID, PLASMA
LACTIC ACID, VENOUS: 1.9 mmol/L (ref 0.5–1.9)
LACTIC ACID, VENOUS: 0.9 mmol/L (ref 0.5–1.9)

## 2018-06-28 MED ORDER — RISPERIDONE 2 MG PO TABS
2.0000 mg | ORAL_TABLET | Freq: Two times a day (BID) | ORAL | Status: DC
  Administered 2018-06-28 – 2018-06-30 (×5): 2 mg via ORAL
  Filled 2018-06-28 (×5): qty 1

## 2018-06-28 MED ORDER — IBUPROFEN 100 MG/5ML PO SUSP
400.0000 mg | Freq: Three times a day (TID) | ORAL | Status: DC | PRN
  Administered 2018-06-28 – 2018-06-30 (×3): 400 mg via ORAL
  Filled 2018-06-28 (×6): qty 20

## 2018-06-28 MED ORDER — LORAZEPAM 0.5 MG PO TABS
0.5000 mg | ORAL_TABLET | Freq: Two times a day (BID) | ORAL | Status: DC
  Administered 2018-06-28 – 2018-07-02 (×9): 0.5 mg via ORAL
  Filled 2018-06-28 (×9): qty 1

## 2018-06-28 MED ORDER — SODIUM CHLORIDE 0.9 % IV SOLN
1000.0000 mL | INTRAVENOUS | Status: DC
  Administered 2018-06-29: 1000 mL via INTRAVENOUS

## 2018-06-28 MED ORDER — VALPROATE SODIUM 500 MG/5ML IV SOLN
500.0000 mg | Freq: Three times a day (TID) | INTRAVENOUS | Status: DC
  Administered 2018-06-28 – 2018-06-30 (×7): 500 mg via INTRAVENOUS
  Filled 2018-06-28 (×7): qty 5

## 2018-06-28 MED ORDER — HYDRALAZINE HCL 20 MG/ML IJ SOLN
10.0000 mg | Freq: Four times a day (QID) | INTRAMUSCULAR | Status: DC | PRN
  Administered 2018-06-28: 10 mg via INTRAVENOUS
  Filled 2018-06-28: qty 1

## 2018-06-28 MED ORDER — DEXTROSE 50 % IV SOLN
1.0000 | Freq: Once | INTRAVENOUS | Status: AC
  Administered 2018-06-28: 50 mL via INTRAVENOUS
  Filled 2018-06-28: qty 50

## 2018-06-28 NOTE — Progress Notes (Addendum)
Informed by RN that patient had large amount of UOP all over bed and room (for at least 2nd time tonight).  Although we are not getting good ml measurements of UOP as a result, patient does appear to be having good amount of UOP clinically.  Safety sitter requested for agitation.  Mom to call home and get exact dosing of home meds so that we can complete med rec and resume some of the home psych meds.  Does have PRN ativan to be used on top of home psych meds for agitation written for.

## 2018-06-28 NOTE — Progress Notes (Addendum)
Ordering cooling blanket since still febrile.  If Creat stable on AM labs, then add NSAID for fever.  UA obtained: c/w UTI with large LE, >50k WBCs.  Mod blood on dipstick but no RBCs on microscopic.  Ordering CPK.  UCx also obtained.

## 2018-06-28 NOTE — ED Notes (Signed)
Complete linen change and pericare. Condom cath placed.  Pt family updated

## 2018-06-28 NOTE — ED Notes (Signed)
Hospital bed requested.

## 2018-06-28 NOTE — Progress Notes (Signed)
PROGRESS NOTE    OAKLAND FANT  ZOX:[id number] DOB: 03-06-1986 DOA: 06/27/2018 PCP: Medicine, [facility]  Brief Narrative: Glenn [name] is a 32 year old male with history of severe autism, nonverbal at baseline, mental retardation was brought to the ER with fevers, rigors and chills.  Assessment & Plan:   Severe sepsis -Suspected to be secondary to pyelonephritis -CT abdomen pelvis showing bilateral perinephric stranding right greater than left -Continue IV fluids, cefepime today, due to severe sepsis we will continue vancomycin today likely discontinue 11/17 -Blood pressure improving, still continues to have fevers in the 102-103 range -Follow-up blood and urine cultures  Severe autism/mental retardation -Nonverbal at baseline -Resume home medications pending reconciliation, I have restarted his Depakote, Ativan and risperidone  Acute kidney injury secondary to sepsis -Improved  DVT prophylaxis: Lovenox Code Status: Full code Family Communication: Discussed with mother at bedside  disposition Plan: Home pending clinical improvement  Consultants:      Procedures:   Antimicrobials:    Subjective: -Continues to have high fevers  Objective: Vitals:   06/28/18 0230 06/28/18 0401 06/28/18 0711 06/28/18 1019  BP: (!) 111/51 123/68 127/74   Pulse: (!) 124 (!) 119 (!) 111 (!) 110  Resp: (!) 34 (!) 25 (!) 29 (!) 29  Temp:  (!) 102.8 F (39.3 C) (!) 100.5 F (38.1 C) (!) 102.5 F (39.2 C)  TempSrc:  Oral Axillary Axillary  SpO2: 94% 96% 100% 100%  Weight:  (!) 150.1 kg    Height:        Intake/Output Summary (Last 24 hours) at 06/28/2018 1102 Last data filed at 06/28/2018 1000 Gross per 24 hour  Intake 2847.27 ml  Output -  Net 2847.27 ml   Filed Weights   06/27/18 1443 06/28/18 0401  Weight: (!) 145.2 kg (!) 150.1 kg    Examination:  General exam: Somnolent obese well-built African-American male laying in bed Respiratory system:  Clear bilaterally Cardiovascular system: S1-S2 present/tachycardic Gastrointestinal system: Soft obese, mildly distended, bowel sounds present Central nervous system: Somnolent but arousable, moves all extremities, nonverbal Extremities: No edema Skin: No rashes, lesions or ulcers Psychiatry: Unable to assess    Data Reviewed:   CBC: Recent Labs  Lab 06/27/18 1651 06/28/18 0444  WBC 11.0* 10.0  NEUTROABS 7.8*  --   HGB 10.9* 9.5*  HCT 33.7* 29.5*  MCV 85.8 85.3  PLT 240 252   Basic Metabolic Panel: Recent Labs  Lab 06/27/18 1651 06/28/18 0444  NA 131* 136  K 3.7 4.2  CL 96* 105  CO2 21* 21*  GLUCOSE 110* 45*  BUN 11 10  CREATININE 1.54* 0.65  CALCIUM 8.7* 8.1*   GFR: Estimated Creatinine Clearance: 201.7 mL/min (by C-G formula based on SCr of 0.65 mg/dL). Liver Function Tests: Recent Labs  Lab 06/27/18 1651 06/28/18 0444  AST 37 39  ALT 32 33  ALKPHOS 46 48  BILITOT 0.7 0.2*  PROT 7.2 6.5  ALBUMIN 3.0* 2.5*   No results for input(s): LIPASE, AMYLASE in the last 168 hours. No results for input(s): AMMONIA in the last 168 hours. Coagulation Profile: No results for input(s): INR, PROTIME in the last 168 hours. Cardiac Enzymes: Recent Labs  Lab 06/28/18 0730  CKTOTAL 80   BNP (last 3 results) No results for input(s): PROBNP in the last 8760 hours. HbA1C: No results for input(s): HGBA1C in the last 72 hours. CBG: Recent Labs  Lab 06/28/18 0703  GLUCAP 163*   Lipid Profile: No results for input(s):  CHOL, HDL, LDLCALC, TRIG, CHOLHDL, LDLDIRECT in the last 72 hours. Thyroid Function Tests: No results for input(s): TSH, T4TOTAL, FREET4, T3FREE, THYROIDAB in the last 72 hours. Anemia Panel: No results for input(s): VITAMINB12, FOLATE, FERRITIN, TIBC, IRON, RETICCTPCT in the last 72 hours. Urine analysis:    Component Value Date/Time   COLORURINE YELLOW 06/27/2018 0107   APPEARANCEUR HAZY (A) 06/27/2018 0107   LABSPEC 1.017 06/27/2018 0107    PHURINE 6.0 06/27/2018 0107   GLUCOSEU NEGATIVE 06/27/2018 0107   HGBUR MODERATE (A) 06/27/2018 0107   BILIRUBINUR NEGATIVE 06/27/2018 0107   KETONESUR 5 (A) 06/27/2018 0107   PROTEINUR NEGATIVE 06/27/2018 0107   NITRITE NEGATIVE 06/27/2018 0107   LEUKOCYTESUR LARGE (A) 06/27/2018 0107   Sepsis Labs: @LABRCNTIP (procalcitonin:4,lacticidven:4)  ) Recent Results (from the past 240 hour(s))  Blood Culture (routine x 2)     Status: None (Preliminary result)   Collection Time: 06/27/18  4:51 PM  Result Value Ref Range Status   Specimen Description BLOOD SITE NOT SPECIFIED  Final   Special Requests   Final    BOTTLES DRAWN AEROBIC AND ANAEROBIC Blood Culture adequate volume   Culture   Final    NO GROWTH < 24 HOURS Performed at [facility], [address], [place], [place] [address]    Report Status PENDING  Incomplete  Blood Culture (routine x 2)     Status: None (Preliminary result)   Collection Time: 06/27/18  5:21 PM  Result Value Ref Range Status   Specimen Description SITE NOT SPECIFIED  Final   Special Requests   Final    BOTTLES DRAWN AEROBIC AND ANAEROBIC Blood Culture results may not be optimal due to an inadequate volume of blood received in culture bottles   Culture   Final    NO GROWTH < 24 HOURS Performed at [facility], [address], [place], [place] [address]    Report Status PENDING  Incomplete  MRSA PCR Screening     Status: None   Collection Time: 06/28/18  4:09 AM  Result Value Ref Range Status   MRSA by PCR NEGATIVE NEGATIVE Final    Comment:        The GeneXpert MRSA Assay (FDA approved for NASAL specimens only), is one component of a comprehensive MRSA colonization surveillance program. It is not intended to diagnose MRSA infection nor to guide or monitor treatment for MRSA infections. Performed at [facility], [address], [place], [place] [address]          Radiology Studies: Ct Chest W Contrast  Result Date:  06/27/2018 CLINICAL DATA:  32 y/o  M; fever of unknown origin. EXAM: CT CHEST, ABDOMEN, AND PELVIS WITH CONTRAST TECHNIQUE: Multidetector CT imaging of the chest, abdomen and pelvis was performed following the standard protocol during bolus administration of intravenous contrast. CONTRAST:  OMNIPAQUE IOHEXOL 300 MG/ML  SOLN COMPARISON:  None. FINDINGS: CT CHEST FINDINGS Cardiovascular: No significant vascular findings. Normal heart size. No pericardial effusion. Mediastinum/Nodes: No enlarged mediastinal, hilar, or axillary lymph nodes. Thyroid gland, trachea, and esophagus demonstrate no significant findings. Lungs/Pleura: Right greater than left lung base linear opacities compatible with platelike atelectasis. No consolidation, effusion, or pneumothorax. Musculoskeletal: No chest wall mass or suspicious bone lesions identified. CT ABDOMEN PELVIS FINDINGS Hepatobiliary: No focal liver abnormality is seen. No gallstones, gallbladder wall thickening, or biliary dilatation. Pancreas: Unremarkable. No pancreatic ductal dilatation or surrounding inflammatory changes. Spleen: Normal in size without focal abnormality. Adrenals/Urinary Tract:  Normal adrenal glands. Patchy areas of hypoattenuation, perinephric stranding, and urothelial enhancement of the right greater than left kidneys. No hydronephrosis or urinary stone disease. Mild wall thickening of the bladder. Stomach/Bowel: Stomach is within normal limits. Appendix appears normal. No evidence of bowel wall thickening, distention, or inflammatory changes. Vascular/Lymphatic: No significant vascular findings are present. No enlarged abdominal or pelvic lymph nodes. Reproductive: Prostate is unremarkable. Other: No abdominal wall hernia or abnormality. No abdominopelvic ascites. Musculoskeletal: No acute or significant osseous findings. IMPRESSION: Patchy areas of hypoattenuation, perinephric stranding, and urothelial enhancement of the right greater than left  kidneys, consistent with acute pyelonephritis. Electronically Signed   By: Mitzi HansenLance  Furusawa-Stratton M.D.   On: 06/27/2018 22:12   Ct Abdomen Pelvis W Contrast  Result Date: 06/27/2018 CLINICAL DATA:  32 y/o  M; fever of unknown origin. EXAM: CT CHEST, ABDOMEN, AND PELVIS WITH CONTRAST TECHNIQUE: Multidetector CT imaging of the chest, abdomen and pelvis was performed following the standard protocol during bolus administration of intravenous contrast. CONTRAST:  125mL OMNIPAQUE IOHEXOL 300 MG/ML  SOLN COMPARISON:  None. FINDINGS: CT CHEST FINDINGS Cardiovascular: No significant vascular findings. Normal heart size. No pericardial effusion. Mediastinum/Nodes: No enlarged mediastinal, hilar, or axillary lymph nodes. Thyroid gland, trachea, and esophagus demonstrate no significant findings. Lungs/Pleura: Right greater than left lung base linear opacities compatible with platelike atelectasis. No consolidation, effusion, or pneumothorax. Musculoskeletal: No chest wall mass or suspicious bone lesions identified. CT ABDOMEN PELVIS FINDINGS Hepatobiliary: No focal liver abnormality is seen. No gallstones, gallbladder wall thickening, or biliary dilatation. Pancreas: Unremarkable. No pancreatic ductal dilatation or surrounding inflammatory changes. Spleen: Normal in size without focal abnormality. Adrenals/Urinary Tract: Normal adrenal glands. Patchy areas of hypoattenuation, perinephric stranding, and urothelial enhancement of the right greater than left kidneys. No hydronephrosis or urinary stone disease. Mild wall thickening of the bladder. Stomach/Bowel: Stomach is within normal limits. Appendix appears normal. No evidence of bowel wall thickening, distention, or inflammatory changes. Vascular/Lymphatic: No significant vascular findings are present. No enlarged abdominal or pelvic lymph nodes. Reproductive: Prostate is unremarkable. Other: No abdominal wall hernia or abnormality. No abdominopelvic ascites.  Musculoskeletal: No acute or significant osseous findings. IMPRESSION: Patchy areas of hypoattenuation, perinephric stranding, and urothelial enhancement of the right greater than left kidneys, consistent with acute pyelonephritis. Electronically Signed   By: Mitzi HansenLance  Furusawa-Stratton M.D.   On: 06/27/2018 22:12   Dg Chest Port 1 View  Result Date: 06/27/2018 CLINICAL DATA:  Fever EXAM: PORTABLE CHEST 1 VIEW COMPARISON:  None. FINDINGS: Degree of inspiration is shallow. There is no appreciable edema or consolidation. There is mild bibasilar atelectasis. Heart is mildly enlarged with pulmonary vascularity normal. No adenopathy. No bone lesions. There is rightward deviation of the upper thoracic trachea. IMPRESSION: Shallow inspiration with bibasilar atelectasis. No frank consolidation. Heart prominent. Rightward deviation of the upper thoracic trachea. Question enlarged thyroid. Electronically Signed   By: Bretta BangWilliam  Woodruff III M.D.   On: 06/27/2018 16:23        Scheduled Meds: . enoxaparin (LOVENOX) injection  40 mg Subcutaneous Q24H  . LORazepam  0.5 mg Oral BID  . risperiDONE  2 mg Oral BID   Continuous Infusions: . sodium chloride Stopped (06/28/18 0417)  . sodium chloride Stopped (06/28/18 0417)  . sodium chloride    . ceFEPime (MAXIPIME) IV 2 g (06/28/18 0457)  . valproate sodium    . vancomycin 1,250 mg (06/28/18 0546)     LOS: 1 day    Time spent: 35min   Zannie CovePreetha Airianna Kreischer,  MD [facility] Page via [email or web address], password [email or web address] After 7PM please contact night-coverage  06/28/2018, 11:02 AM

## 2018-06-28 NOTE — Progress Notes (Signed)
MD paged r/t pt vitals.

## 2018-06-28 NOTE — ED Notes (Signed)
Unable to draw labs,  Pt not cooperating.  Nurse notified..Marland Kitchen

## 2018-06-28 NOTE — ED Provider Notes (Addendum)
[facility] EMERGENCY DEPARTMENT Provider Note   CSN: [id number] Arrival date & time: 06/27/18  1430     History   Chief Complaint Chief Complaint  Patient presents with  . Tremors    HPI Glenn [name] is a 32 y.o. male.  Patient with fairly significant autism.  Patient is nonverbal.  Patient sleeping a lot for the past few days.  Brought in from daycare with concerns for fever.  Arrived here tachycardic febrile and with increased respiratory rate.  Patient's mother states that he has had some congestion and some cough also had a little bit of diarrhea the past 2 days.  No vomiting.  Patient will follow commands but is totally nonverbal.  Very unusual for him to have a fever like this.     Past Medical History:  Diagnosis Date  . Autism   . Mental retardation     Patient Active Problem List   Diagnosis Date Noted  . Sepsis (HCC) 06/27/2018  . Autism 06/27/2018  . Renal insufficiency 06/27/2018    History reviewed. No pertinent surgical history.      Home Medications    Prior to Admission medications   Medication Sig Start Date End Date Taking? Authorizing Provider  benztropine (COGENTIN) 0.5 MG tablet Take 0.5 mg by mouth at bedtime.   Yes [provider]  cetirizine (ZYRTEC) 10 MG tablet Take 1 tablet (10 mg total) by mouth daily. Patient taking differently: Take 10 mg by mouth as needed for allergies.  04/22/18  Yes Yu, Amy V, PA-C  divalproex (DEPAKOTE) 500 MG DR tablet Take 250 mg by mouth 2 (two) times daily.    Yes [provider]  haloperidol (HALDOL) 5 MG tablet Take 2.5 mg by mouth 2 (two) times daily.    Yes [provider]  hydrOXYzine (VISTARIL) 50 MG capsule Take 100 mg by mouth at bedtime.   Yes [provider]  LORazepam (ATIVAN) 0.5 MG tablet Take 0.5 mg by mouth every 8 (eight) hours.   Yes [provider]  mirtazapine (REMERON) 45 MG tablet Take 45 mg by mouth at bedtime.   Yes  [provider]  Olopatadine HCl 0.2 % SOLN Apply 1 drop to eye daily. Patient taking differently: Place 1 drop into both eyes as needed.  06/12/18  Yes Hagler, Arlys John, MD  QUEtiapine (SEROQUEL) 400 MG tablet Take 400 mg by mouth 2 (two) times daily.    Yes [provider]  zolpidem (AMBIEN) 5 MG tablet Take 5 mg by mouth at bedtime as needed for sleep.    Yes [provider]  zolpidem (AMBIEN) 5 MG tablet Take 1 tablet (5 mg total) by mouth at bedtime as needed for sleep. Patient not taking: Reported on 06/28/2018 06/13/12   Dione Booze, MD    Family History Family History  Problem Relation Age of Onset  . Asthma Mother   . Hypertension Mother   . Healthy Father     Social History Social History   Tobacco Use  . Smoking status: Never Smoker  . Smokeless tobacco: Never Used  Substance Use Topics  . Alcohol use: No  . Drug use: No     Allergies   Patient has no known allergies.   Review of Systems Review of Systems  Unable to perform ROS: Psychiatric disorder     Physical Exam Updated Vital Signs BP 122/83   Pulse (!) 121   Temp (!) 103.8 F (39.9 C) (Axillary)  Resp (!) 39   Ht 1.829 m (6')   Wt (!) 150.1 kg   SpO2 92%   BMI 44.89 kg/m   Physical Exam  Constitutional: He appears well-developed and well-nourished. He appears distressed.  HENT:  Head: Normocephalic and atraumatic.  Eyes: Pupils are equal, round, and reactive to light. Conjunctivae and EOM are normal.  Neck: Normal range of motion. Neck supple.  Cardiovascular:  Tachycardic  Pulmonary/Chest: Breath sounds normal. He is in respiratory distress.  Abdominal: Soft. Bowel sounds are normal. He exhibits no distension. There is no tenderness.  Musculoskeletal: He exhibits no edema.  Neurological: He is alert.  Moves all extremities spontaneously.  Skin: Skin is warm.  Nursing note and vitals reviewed.    ED Treatments / Results  Labs (all labs ordered are  listed, but only abnormal results are displayed) Labs Reviewed  COMPREHENSIVE METABOLIC PANEL - Abnormal; Notable for the following components:      Result Value   Sodium 131 (*)    Chloride 96 (*)    CO2 21 (*)    Glucose, Bld 110 (*)    Creatinine, Ser 1.54 (*)    Calcium 8.7 (*)    Albumin 3.0 (*)    GFR calc non Af Amer 59 (*)    All other components within normal limits  CBC WITH DIFFERENTIAL/PLATELET - Abnormal; Notable for the following components:   WBC 11.0 (*)    RBC 3.93 (*)    Hemoglobin 10.9 (*)    HCT 33.7 (*)    Neutro Abs 7.8 (*)    Monocytes Absolute 1.5 (*)    All other components within normal limits  URINALYSIS, ROUTINE W REFLEX MICROSCOPIC - Abnormal; Notable for the following components:   APPearance HAZY (*)    Hgb urine dipstick MODERATE (*)    Ketones, ur 5 (*)    Leukocytes, UA LARGE (*)    WBC, UA >50 (*)    All other components within normal limits  CBC - Abnormal; Notable for the following components:   RBC 3.46 (*)    Hemoglobin 9.5 (*)    HCT 29.5 (*)    All other components within normal limits  COMPREHENSIVE METABOLIC PANEL - Abnormal; Notable for the following components:   CO2 21 (*)    Glucose, Bld 45 (*)    Calcium 8.1 (*)    Albumin 2.5 (*)    Total Bilirubin 0.2 (*)    All other components within normal limits  GLUCOSE, CAPILLARY - Abnormal; Notable for the following components:   Glucose-Capillary 163 (*)    All other components within normal limits  CULTURE, BLOOD (ROUTINE X 2)  CULTURE, BLOOD (ROUTINE X 2)  MRSA PCR SCREENING  URINE CULTURE  VALPROIC ACID LEVEL  INFLUENZA PANEL BY PCR (TYPE A & B)  ETHANOL  HIV ANTIBODY (ROUTINE TESTING W REFLEX)  LACTIC ACID, PLASMA  LACTIC ACID, PLASMA  CK  CBC  BASIC METABOLIC PANEL  I-STAT CG4 LACTIC ACID, ED  I-STAT CG4 LACTIC ACID, ED    EKG None  Radiology Ct Chest W Contrast  Result Date: 06/27/2018 CLINICAL DATA:  32 y/o  M; fever of unknown origin. EXAM: CT CHEST,  ABDOMEN, AND PELVIS WITH CONTRAST TECHNIQUE: Multidetector CT imaging of the chest, abdomen and pelvis was performed following the standard protocol during bolus administration of intravenous contrast. CONTRAST:  125mL OMNIPAQUE IOHEXOL 300 MG/ML  SOLN COMPARISON:  None. FINDINGS: CT CHEST FINDINGS Cardiovascular: No significant vascular findings. Normal heart size.  No pericardial effusion. Mediastinum/Nodes: No enlarged mediastinal, hilar, or axillary lymph nodes. Thyroid gland, trachea, and esophagus demonstrate no significant findings. Lungs/Pleura: Right greater than left lung base linear opacities compatible with platelike atelectasis. No consolidation, effusion, or pneumothorax. Musculoskeletal: No chest wall mass or suspicious bone lesions identified. CT ABDOMEN PELVIS FINDINGS Hepatobiliary: No focal liver abnormality is seen. No gallstones, gallbladder wall thickening, or biliary dilatation. Pancreas: Unremarkable. No pancreatic ductal dilatation or surrounding inflammatory changes. Spleen: Normal in size without focal abnormality. Adrenals/Urinary Tract: Normal adrenal glands. Patchy areas of hypoattenuation, perinephric stranding, and urothelial enhancement of the right greater than left kidneys. No hydronephrosis or urinary stone disease. Mild wall thickening of the bladder. Stomach/Bowel: Stomach is within normal limits. Appendix appears normal. No evidence of bowel wall thickening, distention, or inflammatory changes. Vascular/Lymphatic: No significant vascular findings are present. No enlarged abdominal or pelvic lymph nodes. Reproductive: Prostate is unremarkable. Other: No abdominal wall hernia or abnormality. No abdominopelvic ascites. Musculoskeletal: No acute or significant osseous findings. IMPRESSION: Patchy areas of hypoattenuation, perinephric stranding, and urothelial enhancement of the right greater than left kidneys, consistent with acute pyelonephritis. Electronically Signed   By: Mitzi Hansen M.D.   On: 06/27/2018 22:12   Ct Abdomen Pelvis W Contrast  Result Date: 06/27/2018 CLINICAL DATA:  32 y/o  M; fever of unknown origin. EXAM: CT CHEST, ABDOMEN, AND PELVIS WITH CONTRAST TECHNIQUE: Multidetector CT imaging of the chest, abdomen and pelvis was performed following the standard protocol during bolus administration of intravenous contrast. CONTRAST:  OMNIPAQUE IOHEXOL 300 MG/ML  SOLN COMPARISON:  None. FINDINGS: CT CHEST FINDINGS Cardiovascular: No significant vascular findings. Normal heart size. No pericardial effusion. Mediastinum/Nodes: No enlarged mediastinal, hilar, or axillary lymph nodes. Thyroid gland, trachea, and esophagus demonstrate no significant findings. Lungs/Pleura: Right greater than left lung base linear opacities compatible with platelike atelectasis. No consolidation, effusion, or pneumothorax. Musculoskeletal: No chest wall mass or suspicious bone lesions identified. CT ABDOMEN PELVIS FINDINGS Hepatobiliary: No focal liver abnormality is seen. No gallstones, gallbladder wall thickening, or biliary dilatation. Pancreas: Unremarkable. No pancreatic ductal dilatation or surrounding inflammatory changes. Spleen: Normal in size without focal abnormality. Adrenals/Urinary Tract: Normal adrenal glands. Patchy areas of hypoattenuation, perinephric stranding, and urothelial enhancement of the right greater than left kidneys. No hydronephrosis or urinary stone disease. Mild wall thickening of the bladder. Stomach/Bowel: Stomach is within normal limits. Appendix appears normal. No evidence of bowel wall thickening, distention, or inflammatory changes. Vascular/Lymphatic: No significant vascular findings are present. No enlarged abdominal or pelvic lymph nodes. Reproductive: Prostate is unremarkable. Other: No abdominal wall hernia or abnormality. No abdominopelvic ascites. Musculoskeletal: No acute or significant osseous findings. IMPRESSION: Patchy areas of  hypoattenuation, perinephric stranding, and urothelial enhancement of the right greater than left kidneys, consistent with acute pyelonephritis. Electronically Signed   By: Mitzi Hansen M.D.   On: 06/27/2018 22:12   Dg Chest Port 1 View  Result Date: 06/27/2018 CLINICAL DATA:  Fever EXAM: PORTABLE CHEST 1 VIEW COMPARISON:  None. FINDINGS: Degree of inspiration is shallow. There is no appreciable edema or consolidation. There is mild bibasilar atelectasis. Heart is mildly enlarged with pulmonary vascularity normal. No adenopathy. No bone lesions. There is rightward deviation of the upper thoracic trachea. IMPRESSION: Shallow inspiration with bibasilar atelectasis. No frank consolidation. Heart prominent. Rightward deviation of the upper thoracic trachea. Question enlarged thyroid. Electronically Signed   By: Bretta Bang III M.D.   On: 06/27/2018 16:23    Procedures Procedures (including critical care time)  CRITICAL CARE Performed by: Vanetta Mulders Total critical care time: 60 minutes Critical care time was exclusive of separately billable procedures and treating other patients. Critical care was necessary to treat or prevent imminent or life-threatening deterioration. Critical care was time spent personally by me on the following activities: development of treatment plan with patient and/or surrogate as well as nursing, discussions with consultants, evaluation of patient's response to treatment, examination of patient, obtaining history from patient or surrogate, ordering and performing treatments and interventions, ordering and review of laboratory studies, ordering and review of radiographic studies, pulse oximetry and re-evaluation of patient's condition.   Medications Ordered in ED Medications  0.9 %  sodium chloride infusion ( Intravenous Stopping Infusion hung by another clincian 06/28/18 0417)  0.9 %  sodium chloride infusion ( Intravenous Stopping Infusion hung by  another clincian 06/28/18 0417)  ceFEPIme (MAXIPIME) 2 g in sodium chloride 0.9 % 100 mL IVPB (2 g Intravenous New Bag/Given 06/28/18 1607)  vancomycin (VANCOCIN) 1,250 mg in sodium chloride 0.9 % 250 mL IVPB (1,250 mg Intravenous New Bag/Given 06/28/18 1733)  acetaminophen (TYLENOL) tablet 650 mg (650 mg Oral Given 06/28/18 1606)    Or  acetaminophen (TYLENOL) suppository 650 mg ( Rectal See Alternative 06/28/18 1606)  ondansetron (ZOFRAN) tablet 4 mg (has no administration in time range)    Or  ondansetron (ZOFRAN) injection 4 mg (has no administration in time range)  enoxaparin (LOVENOX) injection 40 mg (40 mg Subcutaneous Given 06/28/18 0823)  valproate (DEPACON) 500 mg in dextrose 5 % 50 mL IVPB (500 mg Intravenous New Bag/Given 06/28/18 1332)  ibuprofen (ADVIL,MOTRIN) 100 MG/5ML suspension 400 mg (400 mg Oral Given 06/28/18 1730)  0.9 %  sodium chloride infusion ( Intravenous Rate/Dose Change 06/28/18 1300)  LORazepam (ATIVAN) tablet 0.5 mg (0.5 mg Oral Given 06/28/18 1331)  risperiDONE (RISPERDAL) tablet 2 mg (2 mg Oral Given 06/28/18 1332)  acetaminophen (TYLENOL) tablet 1,000 mg (1,000 mg Oral Given 06/27/18 1454)  ceFEPIme (MAXIPIME) 2 g in sodium chloride 0.9 % 100 mL IVPB (0 g Intravenous Stopped 06/27/18 1803)  sodium chloride 0.9 % bolus 1,000 mL (0 mLs Intravenous Stopped 06/27/18 1852)  vancomycin (VANCOCIN) 2,500 mg in sodium chloride 0.9 % 500 mL IVPB (0 mg Intravenous Stopped 06/27/18 2102)  LORazepam (ATIVAN) tablet 2 mg (2 mg Oral Given 06/27/18 1623)  acetaminophen (TYLENOL) tablet 1,000 mg (1,000 mg Oral Given 06/27/18 2003)  LORazepam (ATIVAN) injection 1 mg (1 mg Intravenous Given by Other 06/27/18 2140)  iohexol (OMNIPAQUE) 300 MG/ML solution 125 mL (125 mLs Intravenous Contrast Given 06/27/18 2146)  dextrose 50 % solution 50 mL (50 mLs Intravenous Given 06/28/18 0647)     Initial Impression / Assessment and Plan / ED Course  I have reviewed the triage vital  signs and the nursing notes.  Pertinent labs & imaging results that were available during my care of the patient were reviewed by me and considered in my medical decision making (see chart for details).     Patient nonverbal autism.  According to family members he been sleeping a lot for the past 2 days.  At daycare today.  Noted to have significant fever.  Arrived here with temp of 103.2.  Patient without any nausea or vomiting.  He has had some diarrhea but none today.  Also has had some congestion and cough.  Work-up here initially negative started as sepsis work-up because of febrile and tachycardia and tachypnea.  Never was hypotensive.  Sepsis protocol was initiated with broad-spectrum  antibiotics.  Lactic acid was not elevated.  Did not meet criteria for the full 30 cc/kg fluid bolus.  The patient was given a liter.  Patient had to be sedated with oral Ativan in order to just get IVs in place.  So there were some delays in getting his results back.  Chest x-ray was negative expected that patient would may be have a pneumonia.  Urinalysis got contaminated so was not initially collected.  Still pending.  Patient did have a leukocytosis.  Discussed with hospitalist.  We decided to go ahead and do CT of abdomen to rule out any abdominal process although his abdomen was soft and nontender.  Also decided we would go ahead and do CT chest at the same time.  Renal function was adequate enough to do it with IV contrast.  Despite several doses of Tylenol.  Difficulty in controlling his fevers.  Due to slight abnormality in his renal function we stayed away from Motrin.  Particular since we are planning to do the IV dye load do the CT of chest and abdomen.  States CT chest without any additional findings.  CT abdomen consistent with pyelonephritis.  Hospitalist planning to admit to the stepdown unit.  Final Clinical Impressions(s) / ED Diagnoses   Final diagnoses:  Fever, unspecified fever cause    Upper respiratory tract infection, unspecified type  Sepsis without acute organ dysfunction, due to unspecified organism [facility])  Acute pyelonephritis    ED Discharge Orders    None       Vanetta Mulders, MD 06/28/18 1610    Vanetta Mulders, MD 06/28/18 Rickey Primus

## 2018-06-29 ENCOUNTER — Inpatient Hospital Stay (HOSPITAL_COMMUNITY): Payer: Medicaid Other

## 2018-06-29 LAB — URINE CULTURE: CULTURE: NO GROWTH

## 2018-06-29 MED ORDER — FUROSEMIDE 10 MG/ML IJ SOLN
40.0000 mg | Freq: Two times a day (BID) | INTRAMUSCULAR | Status: DC
  Administered 2018-06-29 – 2018-06-30 (×2): 40 mg via INTRAVENOUS
  Filled 2018-06-29 (×2): qty 4

## 2018-06-29 MED ORDER — FUROSEMIDE 10 MG/ML IJ SOLN
40.0000 mg | Freq: Once | INTRAMUSCULAR | Status: AC
  Administered 2018-06-29: 40 mg via INTRAVENOUS
  Filled 2018-06-29: qty 4

## 2018-06-29 NOTE — Progress Notes (Signed)
PROGRESS NOTE    Glenn [name]  ZOX:[id number] DOB: Feb 20, 1986 DOA: 06/27/2018 PCP: Medicine, [facility]  Brief Narrative: Glenn [name] is a 32 year old male with history of severe autism, nonverbal at baseline, mental retardation was brought to the ER with fevers, rigors and chills.  Assessment & Plan:   Severe sepsis  -Present on admission -Suspected to be secondary to pyelonephritis, repeat CXR with concern for PNA vs atelactasis too -CT abdomen pelvis showing bilateral perinephric stranding right greater than left -Starting to improve clinically, continue IV cefepime -stop IVF, appears volume overloaded today  Acute hypoxic respiratory failure  -volume overload -Likely iatrogenic from fluid resuscitation -IV Lasix x2 doses, repeat x-ray  Severe autism/mental retardation -Nonverbal at baseline -Resume home medications pending reconciliation, I have restarted his Depakote, Ativan and risperidone  Acute kidney injury secondary to sepsis -Improved  DVT prophylaxis: Lovenox Code Status: Full code Family Communication: Discussed with mother at bedside  disposition Plan: Home pending clinical improvement  Consultants:      Procedures:   Antimicrobials:    Subjective: -Fever curve improving, some issues with hypoxemia and tachycardia  Objective: Vitals:   06/29/18 0726 06/29/18 0731 06/29/18 0821 06/29/18 1144  BP: (!) 149/91   123/71  Pulse:   (!) 118 (!) 120  Resp:    (!) 39  Temp:  (!) 100.7 F (38.2 C) (!) 102.6 F (39.2 C) 99.1 F (37.3 C)  TempSrc:  Oral Axillary Axillary  SpO2:   92% 91%  Weight:      Height:        Intake/Output Summary (Last 24 hours) at 06/29/2018 1332 Last data filed at 06/29/2018 8119 Gross per 24 hour  Intake 2933 ml  Output 2400 ml  Net 533 ml   Filed Weights   06/27/18 1443 06/28/18 0401  Weight: (!) 145.2 kg (!) 150.1 kg    Examination:  General exam:  well-built African-American male  laying in bed, awake, alert, nonverbal Respiratory system: Decreased breath sounds at both bases Cardiovascular system: S1-S2/tachycardic Gastrointestinal system: Soft obese, mildly distended, bowel sounds present Central nervous system: Alert, moves all extremities, nonverbal Extremities: No edema Skin: No rashes, lesions or ulcers Psychiatry: Unable to assess    Data Reviewed:   CBC: Recent Labs  Lab 06/27/18 1651 06/28/18 0444  WBC 11.0* 10.0  NEUTROABS 7.8*  --   HGB 10.9* 9.5*  HCT 33.7* 29.5*  MCV 85.8 85.3  PLT 240 252   Basic Metabolic Panel: Recent Labs  Lab 06/27/18 1651 06/28/18 0444  NA 131* 136  K 3.7 4.2  CL 96* 105  CO2 21* 21*  GLUCOSE 110* 45*  BUN 11 10  CREATININE 1.54* 0.65  CALCIUM 8.7* 8.1*   GFR: Estimated Creatinine Clearance: 201.7 mL/min (by C-G formula based on SCr of 0.65 mg/dL). Liver Function Tests: Recent Labs  Lab 06/27/18 1651 06/28/18 0444  AST 37 39  ALT 32 33  ALKPHOS 46 48  BILITOT 0.7 0.2*  PROT 7.2 6.5  ALBUMIN 3.0* 2.5*   No results for input(s): LIPASE, AMYLASE in the last 168 hours. No results for input(s): AMMONIA in the last 168 hours. Coagulation Profile: No results for input(s): INR, PROTIME in the last 168 hours. Cardiac Enzymes: Recent Labs  Lab 06/28/18 0730  CKTOTAL 80   BNP (last 3 results) No results for input(s): PROBNP in the last 8760 hours. HbA1C: No results for input(s): HGBA1C in the last 72 hours. CBG: Recent Labs  Lab 06/28/18 0703  GLUCAP 163*   Lipid Profile: No results for input(s): CHOL, HDL, LDLCALC, TRIG, CHOLHDL, LDLDIRECT in the last 72 hours. Thyroid Function Tests: No results for input(s): TSH, T4TOTAL, FREET4, T3FREE, THYROIDAB in the last 72 hours. Anemia Panel: No results for input(s): VITAMINB12, FOLATE, FERRITIN, TIBC, IRON, RETICCTPCT in the last 72 hours. Urine analysis:    Component Value Date/Time   COLORURINE YELLOW 06/27/2018 0107   APPEARANCEUR HAZY (A)  06/27/2018 0107   LABSPEC 1.017 06/27/2018 0107   PHURINE 6.0 06/27/2018 0107   GLUCOSEU NEGATIVE 06/27/2018 0107   HGBUR MODERATE (A) 06/27/2018 0107   BILIRUBINUR NEGATIVE 06/27/2018 0107   KETONESUR 5 (A) 06/27/2018 0107   PROTEINUR NEGATIVE 06/27/2018 0107   NITRITE NEGATIVE 06/27/2018 0107   LEUKOCYTESUR LARGE (A) 06/27/2018 0107   Sepsis Labs: @LABRCNTIP (procalcitonin:4,lacticidven:4)  ) Recent Results (from the past 240 hour(s))  Blood Culture (routine x 2)     Status: None (Preliminary result)   Collection Time: 06/27/18  4:51 PM  Result Value Ref Range Status   Specimen Description BLOOD SITE NOT SPECIFIED  Final   Special Requests   Final    BOTTLES DRAWN AEROBIC AND ANAEROBIC Blood Culture adequate volume   Culture   Final    NO GROWTH < 24 HOURS Performed at [facility], [address] [place], [place] [address]    Report Status PENDING  Incomplete  Blood Culture (routine x 2)     Status: None (Preliminary result)   Collection Time: 06/27/18  5:21 PM  Result Value Ref Range Status   Specimen Description SITE NOT SPECIFIED  Final   Special Requests   Final    BOTTLES DRAWN AEROBIC AND ANAEROBIC Blood Culture results may not be optimal due to an inadequate volume of blood received in culture bottles   Culture   Final    NO GROWTH < 24 HOURS Performed at [facility], [address], [place], [place] [address]    Report Status PENDING  Incomplete  Culture, Urine     Status: None   Collection Time: 06/28/18  1:09 AM  Result Value Ref Range Status   Specimen Description URINE, CLEAN CATCH  Final   Special Requests NONE  Final   Culture   Final    NO GROWTH Performed at [facility], [address] [place], [place] [address]    Report Status 06/29/2018 FINAL  Final  MRSA PCR Screening     Status: None   Collection Time: 06/28/18  4:09 AM  Result Value Ref Range Status   MRSA by PCR NEGATIVE NEGATIVE Final    Comment:        The GeneXpert  MRSA Assay (FDA approved for NASAL specimens only), is one component of a comprehensive MRSA colonization surveillance program. It is not intended to diagnose MRSA infection nor to guide or monitor treatment for MRSA infections. Performed at [facility], [address] [place], [place] [address]          Radiology Studies: Ct Chest W Contrast  Result Date: 06/27/2018 CLINICAL DATA:  32 y/o  M; fever of unknown origin. EXAM: CT CHEST, ABDOMEN, AND PELVIS WITH CONTRAST TECHNIQUE: Multidetector CT imaging of the chest, abdomen and pelvis was performed following the standard protocol during bolus administration of intravenous contrast. CONTRAST:  OMNIPAQUE IOHEXOL 300 MG/ML  SOLN COMPARISON:  None. FINDINGS: CT CHEST FINDINGS Cardiovascular: No significant vascular findings. Normal heart size. No pericardial effusion. Mediastinum/Nodes: No  enlarged mediastinal, hilar, or axillary lymph nodes. Thyroid gland, trachea, and esophagus demonstrate no significant findings. Lungs/Pleura: Right greater than left lung base linear opacities compatible with platelike atelectasis. No consolidation, effusion, or pneumothorax. Musculoskeletal: No chest wall mass or suspicious bone lesions identified. CT ABDOMEN PELVIS FINDINGS Hepatobiliary: No focal liver abnormality is seen. No gallstones, gallbladder wall thickening, or biliary dilatation. Pancreas: Unremarkable. No pancreatic ductal dilatation or surrounding inflammatory changes. Spleen: Normal in size without focal abnormality. Adrenals/Urinary Tract: Normal adrenal glands. Patchy areas of hypoattenuation, perinephric stranding, and urothelial enhancement of the right greater than left kidneys. No hydronephrosis or urinary stone disease. Mild wall thickening of the bladder. Stomach/Bowel: Stomach is within normal limits. Appendix appears normal. No evidence of bowel wall thickening, distention, or inflammatory changes. Vascular/Lymphatic: No  significant vascular findings are present. No enlarged abdominal or pelvic lymph nodes. Reproductive: Prostate is unremarkable. Other: No abdominal wall hernia or abnormality. No abdominopelvic ascites. Musculoskeletal: No acute or significant osseous findings. IMPRESSION: Patchy areas of hypoattenuation, perinephric stranding, and urothelial enhancement of the right greater than left kidneys, consistent with acute pyelonephritis. Electronically Signed   By: Mitzi HansenLance  Furusawa-Stratton M.D.   On: 06/27/2018 22:12   Ct Abdomen Pelvis W Contrast  Result Date: 06/27/2018 CLINICAL DATA:  32 y/o  M; fever of unknown origin. EXAM: CT CHEST, ABDOMEN, AND PELVIS WITH CONTRAST TECHNIQUE: Multidetector CT imaging of the chest, abdomen and pelvis was performed following the standard protocol during bolus administration of intravenous contrast. CONTRAST:  125mL OMNIPAQUE IOHEXOL 300 MG/ML  SOLN COMPARISON:  None. FINDINGS: CT CHEST FINDINGS Cardiovascular: No significant vascular findings. Normal heart size. No pericardial effusion. Mediastinum/Nodes: No enlarged mediastinal, hilar, or axillary lymph nodes. Thyroid gland, trachea, and esophagus demonstrate no significant findings. Lungs/Pleura: Right greater than left lung base linear opacities compatible with platelike atelectasis. No consolidation, effusion, or pneumothorax. Musculoskeletal: No chest wall mass or suspicious bone lesions identified. CT ABDOMEN PELVIS FINDINGS Hepatobiliary: No focal liver abnormality is seen. No gallstones, gallbladder wall thickening, or biliary dilatation. Pancreas: Unremarkable. No pancreatic ductal dilatation or surrounding inflammatory changes. Spleen: Normal in size without focal abnormality. Adrenals/Urinary Tract: Normal adrenal glands. Patchy areas of hypoattenuation, perinephric stranding, and urothelial enhancement of the right greater than left kidneys. No hydronephrosis or urinary stone disease. Mild wall thickening of the  bladder. Stomach/Bowel: Stomach is within normal limits. Appendix appears normal. No evidence of bowel wall thickening, distention, or inflammatory changes. Vascular/Lymphatic: No significant vascular findings are present. No enlarged abdominal or pelvic lymph nodes. Reproductive: Prostate is unremarkable. Other: No abdominal wall hernia or abnormality. No abdominopelvic ascites. Musculoskeletal: No acute or significant osseous findings. IMPRESSION: Patchy areas of hypoattenuation, perinephric stranding, and urothelial enhancement of the right greater than left kidneys, consistent with acute pyelonephritis. Electronically Signed   By: Mitzi HansenLance  Furusawa-Stratton M.D.   On: 06/27/2018 22:12   Dg Chest Port 1 View  Result Date: 06/29/2018 CLINICAL DATA:  Hypoxia. EXAM: PORTABLE CHEST 1 VIEW COMPARISON:  Chest x-ray and CT chest from yesterday. FINDINGS: The heart size and mediastinal contours are within normal limits. Persistent low lung volumes with new patchy bilateral mid to lower lung airspace disease and interstitial thickening. No large pleural effusion. No pneumothorax. No acute osseous abnormality. IMPRESSION: 1. New bilateral mid to lower lung airspace disease and interstitial thickening, which could reflect pulmonary edema or aspiration/pneumonia. Electronically Signed   By: Obie DredgeWilliam T Derry M.D.   On: 06/29/2018 09:02   Dg Chest Port 1 View  Result Date: 06/27/2018 CLINICAL  DATA:  Fever EXAM: PORTABLE CHEST 1 VIEW COMPARISON:  None. FINDINGS: Degree of inspiration is shallow. There is no appreciable edema or consolidation. There is mild bibasilar atelectasis. Heart is mildly enlarged with pulmonary vascularity normal. No adenopathy. No bone lesions. There is rightward deviation of the upper thoracic trachea. IMPRESSION: Shallow inspiration with bibasilar atelectasis. No frank consolidation. Heart prominent. Rightward deviation of the upper thoracic trachea. Question enlarged thyroid. Electronically  Signed   By: Bretta Bang III M.D.   On: 06/27/2018 16:23        Scheduled Meds: . enoxaparin (LOVENOX) injection  40 mg Subcutaneous Q24H  . LORazepam  0.5 mg Oral BID  . risperiDONE  2 mg Oral BID   Continuous Infusions: . sodium chloride Stopped (06/28/18 0417)  . sodium chloride Stopped (06/28/18 0417)  . ceFEPime (MAXIPIME) IV 2 g (06/29/18 0556)  . valproate sodium 500 mg (06/29/18 0646)     LOS: 2 days    Time spent:   Zannie Cove, MD [facility] Page via [email or web address], password [email or web address] After 7PM please contact night-coverage  06/29/2018, 1:32 PM

## 2018-06-29 NOTE — Progress Notes (Signed)
PT with autism refused am lab draw observed by removal of arm from staff. MD paged- please consider labs around Ativan dosing.

## 2018-06-29 NOTE — Progress Notes (Signed)
MD paged.Please evaluate pt as pt is trembling. Pt has autism, but c/o pain at present. temp 100.7155f oral.

## 2018-06-29 NOTE — Progress Notes (Signed)
Lab attempt *2 to obtain blood work form pt. Staff assisted- lab work unsafe and unsuccessful to obtain. Pt mom states that needs to be a big man present to assist with getting pt with labs. Charge made aware.

## 2018-06-30 MED ORDER — FUROSEMIDE 10 MG/ML IJ SOLN
40.0000 mg | Freq: Two times a day (BID) | INTRAMUSCULAR | Status: AC
  Administered 2018-06-30: 40 mg via INTRAVENOUS
  Filled 2018-06-30: qty 4

## 2018-06-30 MED ORDER — ENOXAPARIN SODIUM 80 MG/0.8ML ~~LOC~~ SOLN
0.5000 mg/kg | SUBCUTANEOUS | Status: DC
  Filled 2018-06-30 (×2): qty 0.8

## 2018-06-30 MED ORDER — HALOPERIDOL 1 MG PO TABS
2.0000 mg | ORAL_TABLET | Freq: Two times a day (BID) | ORAL | Status: DC
  Administered 2018-06-30 – 2018-07-02 (×4): 2 mg via ORAL
  Filled 2018-06-30 (×5): qty 2

## 2018-06-30 MED ORDER — BENZTROPINE MESYLATE 0.5 MG PO TABS
0.5000 mg | ORAL_TABLET | Freq: Every day | ORAL | Status: DC
  Administered 2018-06-30 – 2018-07-01 (×2): 0.5 mg via ORAL
  Filled 2018-06-30 (×2): qty 1

## 2018-06-30 MED ORDER — QUETIAPINE FUMARATE 25 MG PO TABS
200.0000 mg | ORAL_TABLET | Freq: Two times a day (BID) | ORAL | Status: DC
  Administered 2018-06-30 – 2018-07-02 (×4): 200 mg via ORAL
  Filled 2018-06-30 (×5): qty 8

## 2018-06-30 MED ORDER — DIVALPROEX SODIUM 250 MG PO DR TAB
250.0000 mg | DELAYED_RELEASE_TABLET | Freq: Two times a day (BID) | ORAL | Status: DC
  Administered 2018-06-30 – 2018-07-02 (×4): 250 mg via ORAL
  Filled 2018-06-30 (×4): qty 1

## 2018-06-30 MED ORDER — MIRTAZAPINE 15 MG PO TABS
30.0000 mg | ORAL_TABLET | Freq: Every day | ORAL | Status: DC
  Administered 2018-06-30 – 2018-07-01 (×2): 30 mg via ORAL
  Filled 2018-06-30 (×2): qty 2

## 2018-06-30 NOTE — Evaluation (Signed)
Clinical/Bedside Swallow Evaluation Patient Details  Name: Glenn DawleyBrandon C Christopherson MRN: [id number] Date of Birth: 06/10/1986  Today's Date: 06/30/2018 Time: SLP Start Time (ACUTE ONLY): 1148 SLP Stop Time (ACUTE ONLY): 1202 SLP Time Calculation (min) (ACUTE ONLY): 14 min  Past Medical History:  Past Medical History:  Diagnosis Date  . Autism   . Mental retardation    Past Surgical History: History reviewed. No pertinent surgical history. HPI:  Glenn [name] is a 32 year old male with history of severe autism, nonverbal at baseline, mental retardation was brought to the ER with fevers, rigors and chills. Admitted with severe sepsis suspected secondary to pyelonephritis; CXR 06/29/18: New bilateral mid to lower lung airspace disease and interstitial thickening, which could reflect pulmonary edema or aspiration/pneumonia.   Assessment / Plan / Recommendation Clinical Impression  Patient presents with oropharyngeal swallow which appears at bedside to be within functional limits with adequate airway protection. No overt signs of aspiration observed despite challenging with consecutive straw sips of thin liquids in excess of 3oz. Swallow initiation appears timely. Oral phase noted for tongue thrust pattern during mastication, however oral manipulation and clearance appears adequate. Pt noted with shallow respirations, placing him at mild risk for aspiration as his rate of intake is rapid. Despite impulsivity, there were no signs of airway compromise with 16 oz water, 4 oz puree and several crackers. Discussed with mother, RN re: facilitating rest breaks during meals if pt is short of breath. Recommend regular diet with thin liquids, no further skilled ST needs identified. Will s/o.   SLP Visit Diagnosis: Dysphagia, unspecified (R13.10)    Aspiration Risk  Mild aspiration risk    Diet Recommendation Regular;Thin liquid   Liquid Administration via: Cup;Straw Medication Administration: Whole meds  with liquid Supervision: Patient able to self feed Compensations: Slow rate;Small sips/bites Postural Changes: Seated upright at 90 degrees    Other  Recommendations Oral Care Recommendations: Oral care BID   Follow up Recommendations 24 hour supervision/assistance      Frequency and Duration Other (Comment)(evaluation only)          Prognosis Prognosis for Safe Diet Advancement: Good      Swallow Study   General Date of Onset: 06/27/18 HPI: Glenn [name] is a 32 year old male with history of severe autism, nonverbal at baseline, mental retardation was brought to the ER with fevers, rigors and chills. Admitted with severe sepsis suspected secondary to pyelonephritis; CXR 06/29/18: New bilateral mid to lower lung airspace disease and interstitial thickening, which could reflect pulmonary edema or aspiration/pneumonia. Type of Study: Bedside Swallow Evaluation Previous Swallow Assessment: none in chart Diet Prior to this Study: Regular;Thin liquids Temperature Spikes Noted: Yes(100.2) Respiratory Status: Room air History of Recent Intubation: No Behavior/Cognition: Alert;Cooperative Oral Cavity Assessment: Within Functional Limits Oral Care Completed by SLP: No Oral Cavity - Dentition: Adequate natural dentition Vision: Functional for self-feeding Self-Feeding Abilities: Able to feed self Patient Positioning: Upright in bed Baseline Vocal Quality: Not observed(pt does not communicate verbally) Volitional Cough: Cognitively unable to elicit Volitional Swallow: Unable to elicit    Oral/Motor/Sensory Function Overall Oral Motor/Sensory Function: Within functional limits   Ice Chips Ice chips: Not tested   Thin Liquid Thin Liquid: Within functional limits Presentation: Straw;Self Fed    Nectar Thick Nectar Thick Liquid: Not tested   Honey Thick Honey Thick Liquid: Not tested   Puree Puree: Within functional limits Presentation: Self Fed;Spoon   Solid     Solid:  Within functional limits Presentation: Self Fed  Rondel Baton, [place], CCC-SLP Speech-Language Pathologist Acute Rehabilitation Services Pager: [phone] Office: [phone]   Arlana Lindau 06/30/2018,12:11 PM

## 2018-06-30 NOTE — Progress Notes (Addendum)
PROGRESS NOTE    Glenn [name]  WUJ:[id number] DOB: Sep 03, 1985 DOA: 06/27/2018 PCP: Medicine, [facility]  Brief Narrative: Glenn [name] is a 32 year old male with history of severe autism, nonverbal at baseline, mental retardation was brought to the ER with fevers, rigors and chills. -admitted with severe Sepsis, encephalopathy, admission CT was concerning for Pyelonephritis, Rx with Abx and fluid resuscitation  Assessment & Plan:   Severe sepsis  -Present on admission -Suspected to be secondary to pyelonephritis, repeat CXR with concern for PNA vs atelactasis too -CT abdomen pelvis noted bilateral perinephric stranding right greater than left and suggested Pyelonephritis -Starting to improve clinically, fevers improving -continue IV cefepime -sepsis physiology has resolved  Acute hypoxic respiratory failure  -volume overload and ? infiltrates -Likely iatrogenic from fluid resuscitation -continue IV lasix, another dose today -repeat CXR in am -despite multiple attempts, unable to draw blood from pt today, try again tomorrow  Severe autism/mental retardation -Nonverbal at baseline -Resume home medications -seroquel at lower dose, haldol, cogentin, remeron, -continue depakote, change to PO  Acute kidney injury secondary to sepsis -Improved  DVT prophylaxis: Lovenox Code Status: Full code Family Communication: Discussed with mother at bedside  disposition Plan: Home in 2days likely  Consultants:      Procedures:   Antimicrobials:  Antibiotics Given (last 72 hours)    Date/Time Action Medication Dose Rate   06/27/18 1727 New Bag/Given   metroNIDAZOLE (FLAGYL) IVPB 500 mg 500 mg 100 mL/hr   06/27/18 1733 New Bag/Given   ceFEPIme (MAXIPIME) 2 g in sodium chloride 0.9 % 100 mL IVPB 2 g 200 mL/hr   06/27/18 1902 New Bag/Given   vancomycin (VANCOCIN) 2,500 mg in sodium chloride 0.9 % 500 mL IVPB 2,500 mg 250 mL/hr   06/28/18 0457 New Bag/Given   ceFEPIme (MAXIPIME) 2 g in sodium chloride 0.9 % 100 mL IVPB 2 g 200 mL/hr   06/28/18 0546 New Bag/Given   vancomycin (VANCOCIN) 1,250 mg in sodium chloride 0.9 % 250 mL IVPB 1,250 mg 166.7 mL/hr   06/28/18 9562 New Bag/Given   metroNIDAZOLE (FLAGYL) IVPB 500 mg 500 mg 100 mL/hr   06/28/18 1607 New Bag/Given   ceFEPIme (MAXIPIME) 2 g in sodium chloride 0.9 % 100 mL IVPB 2 g 200 mL/hr   06/28/18 1733 New Bag/Given   vancomycin (VANCOCIN) 1,250 mg in sodium chloride 0.9 % 250 mL IVPB 1,250 mg 166.7 mL/hr   06/29/18 0556 New Bag/Given   ceFEPIme (MAXIPIME) 2 g in sodium chloride 0.9 % 100 mL IVPB 2 g 200 mL/hr   06/29/18 0800 New Bag/Given   vancomycin (VANCOCIN) 1,250 mg in sodium chloride 0.9 % 250 mL IVPB 1,250 mg 166.7 mL/hr   06/29/18 1637 New Bag/Given   ceFEPIme (MAXIPIME) 2 g in sodium chloride 0.9 % 100 mL IVPB 2 g 200 mL/hr   06/30/18 0505 New Bag/Given   ceFEPIme (MAXIPIME) 2 g in sodium chloride 0.9 % 100 mL IVPB 2 g 200 mL/hr       Subjective: -Starting to improve, fever curve improving, remains slightly hypoxic  Objective: Vitals:   06/30/18 0300 06/30/18 0400 06/30/18 0834 06/30/18 1214  BP:  (!) 104/57 131/75 108/71  Pulse: 64 86 72 (!) 117  Resp: 13 (!) 26 (!) 39 (!) 22  Temp:  98.8 F (37.1 C) 99.9 F (37.7 C) 98.2 F (36.8 C)  TempSrc:  Oral Oral Oral  SpO2: 100% 94% 94% 94%  Weight:      Height:  Intake/Output Summary (Last 24 hours) at 06/30/2018 1321 Last data filed at 06/30/2018 1255 Gross per 24 hour  Intake 1055.19 ml  Output 2650 ml  Net -1594.81 ml   Filed Weights   06/27/18 1443 06/28/18 0401  Weight: (!) 145.2 kg (!) 150.1 kg    Examination:  Gen: Well-built African-American male laying in bed, smiling awake alert nonverbal HEENT: PERRLA, Neck supple, no JVD Lungs: Decreased breath sounds at both bases CVS: S1-S2/tachycardic Abd: soft, Non tender, non distended, BS present Extremities: No edema Skin: no new  rashes Psychiatry: Unable to assess    Data Reviewed:   CBC: Recent Labs  Lab 06/27/18 1651 06/28/18 0444  WBC 11.0* 10.0  NEUTROABS 7.8*  --   HGB 10.9* 9.5*  HCT 33.7* 29.5*  MCV 85.8 85.3  PLT 240 252   Basic Metabolic Panel: Recent Labs  Lab 06/27/18 1651 06/28/18 0444  NA 131* 136  K 3.7 4.2  CL 96* 105  CO2 21* 21*  GLUCOSE 110* 45*  BUN 11 10  CREATININE 1.54* 0.65  CALCIUM 8.7* 8.1*   GFR: Estimated Creatinine Clearance: 201.7 mL/min (by C-G formula based on SCr of 0.65 mg/dL). Liver Function Tests: Recent Labs  Lab 06/27/18 1651 06/28/18 0444  AST 37 39  ALT 32 33  ALKPHOS 46 48  BILITOT 0.7 0.2*  PROT 7.2 6.5  ALBUMIN 3.0* 2.5*   No results for input(s): LIPASE, AMYLASE in the last 168 hours. No results for input(s): AMMONIA in the last 168 hours. Coagulation Profile: No results for input(s): INR, PROTIME in the last 168 hours. Cardiac Enzymes: Recent Labs  Lab 06/28/18 0730  CKTOTAL 80   BNP (last 3 results) No results for input(s): PROBNP in the last 8760 hours. HbA1C: No results for input(s): HGBA1C in the last 72 hours. CBG: Recent Labs  Lab 06/28/18 0703  GLUCAP 163*   Lipid Profile: No results for input(s): CHOL, HDL, LDLCALC, TRIG, CHOLHDL, LDLDIRECT in the last 72 hours. Thyroid Function Tests: No results for input(s): TSH, T4TOTAL, FREET4, T3FREE, THYROIDAB in the last 72 hours. Anemia Panel: No results for input(s): VITAMINB12, FOLATE, FERRITIN, TIBC, IRON, RETICCTPCT in the last 72 hours. Urine analysis:    Component Value Date/Time   COLORURINE YELLOW 06/27/2018 0107   APPEARANCEUR HAZY (A) 06/27/2018 0107   LABSPEC 1.017 06/27/2018 0107   PHURINE 6.0 06/27/2018 0107   GLUCOSEU NEGATIVE 06/27/2018 0107   HGBUR MODERATE (A) 06/27/2018 0107   BILIRUBINUR NEGATIVE 06/27/2018 0107   KETONESUR 5 (A) 06/27/2018 0107   PROTEINUR NEGATIVE 06/27/2018 0107   NITRITE NEGATIVE 06/27/2018 0107   LEUKOCYTESUR LARGE (A)  06/27/2018 0107   Sepsis Labs: @LABRCNTIP (procalcitonin:4,lacticidven:4)  ) Recent Results (from the past 240 hour(s))  Blood Culture (routine x 2)     Status: None (Preliminary result)   Collection Time: 06/27/18  4:51 PM  Result Value Ref Range Status   Specimen Description BLOOD SITE NOT SPECIFIED  Final   Special Requests   Final    BOTTLES DRAWN AEROBIC AND ANAEROBIC Blood Culture adequate volume   Culture   Final    NO GROWTH 3 DAYS Performed at [facility], [address], [place], [place] [address]    Report Status PENDING  Incomplete  Blood Culture (routine x 2)     Status: None (Preliminary result)   Collection Time: 06/27/18  5:21 PM  Result Value Ref Range Status   Specimen Description SITE NOT SPECIFIED  Final   Special Requests  Final    BOTTLES DRAWN AEROBIC AND ANAEROBIC Blood Culture results may not be optimal due to an inadequate volume of blood received in culture bottles   Culture   Final    NO GROWTH 3 DAYS Performed at [facility], [address] [place], [place] [address]    Report Status PENDING  Incomplete  Culture, Urine     Status: None   Collection Time: 06/28/18  1:09 AM  Result Value Ref Range Status   Specimen Description URINE, CLEAN CATCH  Final   Special Requests NONE  Final   Culture   Final    NO GROWTH Performed at [facility], [address] [place], [place] [address]    Report Status 06/29/2018 FINAL  Final  MRSA PCR Screening     Status: None   Collection Time: 06/28/18  4:09 AM  Result Value Ref Range Status   MRSA by PCR NEGATIVE NEGATIVE Final    Comment:        The GeneXpert MRSA Assay (FDA approved for NASAL specimens only), is one component of a comprehensive MRSA colonization surveillance program. It is not intended to diagnose MRSA infection nor to guide or monitor treatment for MRSA infections. Performed at [facility], [address] [place], [place] [address]           Radiology Studies: Dg Chest Port 1 View  Result Date: 06/29/2018 CLINICAL DATA:  Hypoxia. EXAM: PORTABLE CHEST 1 VIEW COMPARISON:  Chest x-ray and CT chest from yesterday. FINDINGS: The heart size and mediastinal contours are within normal limits. Persistent low lung volumes with new patchy bilateral mid to lower lung airspace disease and interstitial thickening. No large pleural effusion. No pneumothorax. No acute osseous abnormality. IMPRESSION: 1. New bilateral mid to lower lung airspace disease and interstitial thickening, which could reflect pulmonary edema or aspiration/pneumonia. Electronically Signed   By: Obie DredgeWilliam T Derry M.D.   On: 06/29/2018 09:02        Scheduled Meds: . benztropine  0.5 mg Oral QHS  . divalproex  250 mg Oral Q12H  . [START ON 07/01/2018] enoxaparin (LOVENOX) injection  0.5 mg/kg Subcutaneous Q24H  . furosemide  40 mg Intravenous BID  . haloperidol  2 mg Oral BID  . LORazepam  0.5 mg Oral BID  . mirtazapine  30 mg Oral QHS  . QUEtiapine  200 mg Oral BID   Continuous Infusions: . ceFEPime (MAXIPIME) IV 2 g (06/30/18 0505)     LOS: 3 days    Time spent: 25min   Zannie CovePreetha Arnetha Silverthorne, MD [facility] Page via [email or web address], password [email or web address] After 7PM please contact night-coverage  06/30/2018, 1:21 PM

## 2018-06-30 NOTE — Progress Notes (Signed)
Pharmacy Antibiotic Note  Glenn [name] is a 32 y.o. male admitted on 06/27/2018 with pyelonephritis.  Pharmacy has been consulted for Cefepime dosing. Urine culture is negative, blood cultures are no growth to date. His last creatinine was back to his baseline of 0.6. He has refused his labs for the past two days.  He is on Day #4 of antibiotics.  Plan: Continue Cefepime 2g IV q12h Follow for possible de-escalation.   Height: 6' (182.9 cm) Weight: (!) 331 lb (150.1 kg) IBW/kg (Calculated) : 77.6  Temp (24hrs), Avg:99.1 F (37.3 C), Min:98 F (36.7 C), Max:100.3 F (37.9 C)  Recent Labs  Lab 06/27/18 1651 06/27/18 1658 06/28/18 0443 06/28/18 0444 06/28/18 0730  WBC 11.0*  --   --  10.0  --   CREATININE 1.54*  --   --  0.65  --   LATICACIDVEN  --  0.94 1.9  --  0.9    Estimated Creatinine Clearance: 201.7 mL/min (by C-G formula based on SCr of 0.65 mg/dL).    No Known Allergies  Antimicrobials this admission: Cefepime 11/15>> Vancomycin 11/15>>11/16 Metronidazole 11/15>>11/16  11/15 Bcx: ngtd 11/16 Ucx: neg 11/16 MRSA PCR neg  Thank you for allowing pharmacy to be a part of this patient's care.  Estella HuskMichelle Rim [name], Pharm.D., BCPS, BCIDP Clinical Pharmacist Phone: [phone] Please check AMION for all [facility] numbers 06/30/2018, 9:40 AM

## 2018-07-01 ENCOUNTER — Inpatient Hospital Stay (HOSPITAL_COMMUNITY): Payer: Medicaid Other

## 2018-07-01 LAB — CBC
HEMATOCRIT: 35.3 % — AB (ref 39.0–52.0)
HEMOGLOBIN: 11.2 g/dL — AB (ref 13.0–17.0)
MCH: 27.1 pg (ref 26.0–34.0)
MCHC: 31.7 g/dL (ref 30.0–36.0)
MCV: 85.3 fL (ref 80.0–100.0)
Platelets: 381 10*3/uL (ref 150–400)
RBC: 4.14 MIL/uL — ABNORMAL LOW (ref 4.22–5.81)
RDW: 14.9 % (ref 11.5–15.5)
WBC: 7.9 10*3/uL (ref 4.0–10.5)
nRBC: 0 % (ref 0.0–0.2)

## 2018-07-01 LAB — BASIC METABOLIC PANEL
Anion gap: 10 (ref 5–15)
BUN: 14 mg/dL (ref 6–20)
CHLORIDE: 101 mmol/L (ref 98–111)
CO2: 27 mmol/L (ref 22–32)
CREATININE: 1.15 mg/dL (ref 0.61–1.24)
Calcium: 9.1 mg/dL (ref 8.9–10.3)
GFR calc Af Amer: >60 mL/min (ref >60)
Glucose, Bld: 133 mg/dL — ABNORMAL HIGH (ref 70–99)
Potassium: 4.4 mmol/L (ref 3.5–5.1)
Sodium: 138 mmol/L (ref 135–145)

## 2018-07-01 LAB — HEMOGLOBIN A1C
Hgb A1c MFr Bld: 7.9 % — ABNORMAL HIGH (ref 4.8–5.6)
Mean Plasma Glucose: 180.03 mg/dL

## 2018-07-01 MED ORDER — FUROSEMIDE 40 MG PO TABS
40.0000 mg | ORAL_TABLET | Freq: Every day | ORAL | Status: AC
  Administered 2018-07-01: 40 mg via ORAL
  Filled 2018-07-01: qty 1

## 2018-07-01 MED ORDER — CEFDINIR 300 MG PO CAPS
300.0000 mg | ORAL_CAPSULE | Freq: Two times a day (BID) | ORAL | Status: DC
  Administered 2018-07-01 – 2018-07-02 (×3): 300 mg via ORAL
  Filled 2018-07-01 (×3): qty 1

## 2018-07-01 MED ORDER — FUROSEMIDE 10 MG/ML IJ SOLN
40.0000 mg | Freq: Two times a day (BID) | INTRAMUSCULAR | Status: DC
  Filled 2018-07-01 (×2): qty 4

## 2018-07-01 NOTE — Progress Notes (Addendum)
Please see in addition to PT note:   SATURATION QUALIFICATIONS: (This note is used to comply with regulatory documentation for home oxygen)  Patient Saturations on Room Air at Rest = 97% on 4LPM O2 (did not assess room air at rest)  Patient Saturations on Room Air while Ambulating = 82%   Patient Saturations on 2 Liters of oxygen while Ambulating = 92%   Please briefly explain why patient needs home oxygen:SpO2 drop with activity on room air.   If sent home with home oxygen, patient will likely require Inogen backpack O2 tank due to impulsivity and poor safety awareness/poor ability to perform O2 line management with traditional tank.   [name] Unger PT, DPT, CBIS  Supplemental Physical Therapist [facility]    Pager [phone] Acute Rehab Office [phone]

## 2018-07-01 NOTE — Progress Notes (Addendum)
PT Cancellation Note  Patient Details Name: Glenn [name] MRN: [id number] DOB: 10/18/1985   Cancelled Treatment:    Reason Eval/Treat Not Completed: Other (comment) RN requests PT wait until after she gives morning meds to try PT; will attempt to return later in day if time/schedule allow.    [name]  PT, DPT, CBIS  Supplemental Physical Therapist [facility]    Pager [phone] Acute Rehab Office [phone]

## 2018-07-01 NOTE — Progress Notes (Signed)
PROGRESS NOTE    Glenn [name]  ZOX:[id number] DOB: [birth year] DOA: 06/27/2018 PCP: Medicine, [facility]  Brief Narrative: Glenn [name] is a 32 year old male with history of severe autism, nonverbal at baseline, mental retardation was brought to the ER with fevers, rigors and chills. -admitted with severe Sepsis, hypotension, encephalopathy, admission CT was concerning for Pyelonephritis, Rx with Abx and fluid resuscitation. -Day 2 into hospital stay patient became progressively hypoxic, given Lasix, repeat x-ray at this time concerning for fluid +/- pneumonia, given additonal lasix for another day -Mentation much improved, back to baseline, still requiring few liters of oxygen, fevers have resolved, antibiotics de-escalated  Assessment & Plan:   Severe sepsis  -Present on admission -Suspected to be secondary to pyelonephritis, repeat CXR with concern for PNA vs atelactasis too -CT abdomen pelvis noted bilateral perinephric stranding right greater than left and suggested Pyelonephritis -Remained febrile for 48 hours and patient finally improved slowly -CT abdomen was concerning for pyelonephritis however urine culture came back negative, and repeat x-ray did raise concern for pneumonia although I suspect this is primarily fluid overload from aggressive fluid resuscitation for severe sepsis -sepsis physiology has resolved -We will de-escalate antibiotics to oral Cefdinir to cover urinary and pulmonary source -Patient has severe mental retardation, nonverbal at baseline hence unable to assist with history/subjective complaints -ambulate, Pt eval  Acute hypoxic respiratory failure  -volume overload most likely and ? infiltrates -Likely iatrogenic from aggressive fluid resuscitation for hypotension/sepsis -continue IV lasix, another dose today -repeat CXR today -wean off O2  Severe autism/mental retardation -Nonverbal at baseline -Resume home medications -seroquel  at lower dose, haldol, cogentin, remeron, -continue depakote, change to PO -mental status at baseline now, smiles, child like mannerisms  Acute kidney injury secondary to sepsis -Improved  Hyperglycemia -check Hba1c  DVT prophylaxis: Lovenox Code Status: Full code Family Communication: Discussed with mother at bedside  disposition Plan: Home in 1-2days likely  Consultants:      Procedures:   Antimicrobials:  Antibiotics Given (last 72 hours)    Date/Time Action Medication Dose Rate   06/28/18 1607 New Bag/Given   ceFEPIme (MAXIPIME) 2 g in sodium chloride 0.9 % 100 mL IVPB 2 g 200 mL/hr   06/28/18 1733 New Bag/Given   vancomycin (VANCOCIN) 1,250 mg in sodium chloride 0.9 % 250 mL IVPB 1,250 mg 166.7 mL/hr   06/29/18 0556 New Bag/Given   ceFEPIme (MAXIPIME) 2 g in sodium chloride 0.9 % 100 mL IVPB 2 g 200 mL/hr   06/29/18 0800 New Bag/Given   vancomycin (VANCOCIN) 1,250 mg in sodium chloride 0.9 % 250 mL IVPB 1,250 mg 166.7 mL/hr   06/29/18 1637 New Bag/Given   ceFEPIme (MAXIPIME) 2 g in sodium chloride 0.9 % 100 mL IVPB 2 g 200 mL/hr   06/30/18 0505 New Bag/Given   ceFEPIme (MAXIPIME) 2 g in sodium chloride 0.9 % 100 mL IVPB 2 g 200 mL/hr   06/30/18 1706 New Bag/Given   ceFEPIme (MAXIPIME) 2 g in sodium chloride 0.9 % 100 mL IVPB 2 g 200 mL/hr   07/01/18 0554 New Bag/Given   ceFEPIme (MAXIPIME) 2 g in sodium chloride 0.9 % 100 mL IVPB 2 g 200 mL/hr       Subjective: -Feels better, weaned down to 2 L now -Smiling, mental status back to baseline, eating, has not gotten out of bed yet  Objective: Vitals:   06/30/18 1214 06/30/18 2000 06/30/18 2018 07/01/18 0809  BP: 108/71  110/68 123/77  Pulse: Marland Kitchen)  117  (!) 111 100  Resp: (!) 22 20 20 13   Temp: 98.2 F (36.8 C)  99.1 F (37.3 C) 98.7 F (37.1 C)  TempSrc: Oral  Oral Oral  SpO2: 94%  98% 97%  Weight:      Height:        Intake/Output Summary (Last 24 hours) at 07/01/2018 1341 Last data filed at  07/01/2018 0640 Gross per 24 hour  Intake 864.13 ml  Output 1000 ml  Net -135.87 ml   Filed Weights   06/27/18 1443 06/28/18 0401  Weight: (!) 145.2 kg (!) 150.1 kg    Examination:  Gen: Obese well-built African-American male sitting in bed, smiling, alert awake, nonverbal   HEENT: PERRLA, Neck supple, no JVD Lungs: Few basilar crackles CVS: S2/regular rate rhythm Abd: soft, Non tender, non distended, BS present Extremities: Trace edema Skin: no new rashes Psychiatry: Pleasant, unable to converse    Data Reviewed:   CBC: Recent Labs  Lab 06/27/18 1651 06/28/18 0444 07/01/18 0500  WBC 11.0* 10.0 7.9  NEUTROABS 7.8*  --   --   HGB 10.9* 9.5* 11.2*  HCT 33.7* 29.5* 35.3*  MCV 85.8 85.3 85.3  PLT 240 252 381   Basic Metabolic Panel: Recent Labs  Lab 06/27/18 1651 06/28/18 0444 07/01/18 0500  NA 131* 136 138  K 3.7 4.2 4.4  CL 96* 105 101  CO2 21* 21* 27  GLUCOSE 110* 45* 133*  BUN 11 10 14   CREATININE 1.54* 0.65 1.15  CALCIUM 8.7* 8.1* 9.1   GFR: Estimated Creatinine Clearance: 140.3 mL/min (by C-G formula based on SCr of 1.15 mg/dL). Liver Function Tests: Recent Labs  Lab 06/27/18 1651 06/28/18 0444  AST 37 39  ALT 32 33  ALKPHOS 46 48  BILITOT 0.7 0.2*  PROT 7.2 6.5  ALBUMIN 3.0* 2.5*   No results for input(s): LIPASE, AMYLASE in the last 168 hours. No results for input(s): AMMONIA in the last 168 hours. Coagulation Profile: No results for input(s): INR, PROTIME in the last 168 hours. Cardiac Enzymes: Recent Labs  Lab 06/28/18 0730  CKTOTAL 80   BNP (last 3 results) No results for input(s): PROBNP in the last 8760 hours. HbA1C: No results for input(s): HGBA1C in the last 72 hours. CBG: Recent Labs  Lab 06/28/18 0703  GLUCAP 163*   Lipid Profile: No results for input(s): CHOL, HDL, LDLCALC, TRIG, CHOLHDL, LDLDIRECT in the last 72 hours. Thyroid Function Tests: No results for input(s): TSH, T4TOTAL, FREET4, T3FREE, THYROIDAB in the  last 72 hours. Anemia Panel: No results for input(s): VITAMINB12, FOLATE, FERRITIN, TIBC, IRON, RETICCTPCT in the last 72 hours. Urine analysis:    Component Value Date/Time   COLORURINE YELLOW 06/27/2018 0107   APPEARANCEUR HAZY (A) 06/27/2018 0107   LABSPEC 1.017 06/27/2018 0107   PHURINE 6.0 06/27/2018 0107   GLUCOSEU NEGATIVE 06/27/2018 0107   HGBUR MODERATE (A) 06/27/2018 0107   BILIRUBINUR NEGATIVE 06/27/2018 0107   KETONESUR 5 (A) 06/27/2018 0107   PROTEINUR NEGATIVE 06/27/2018 0107   NITRITE NEGATIVE 06/27/2018 0107   LEUKOCYTESUR LARGE (A) 06/27/2018 0107   Sepsis Labs: @LABRCNTIP (procalcitonin:4,lacticidven:4)  ) Recent Results (from the past 240 hour(s))  Blood Culture (routine x 2)     Status: None (Preliminary result)   Collection Time: 06/27/18  4:51 PM  Result Value Ref Range Status   Specimen Description BLOOD SITE NOT SPECIFIED  Final   Special Requests   Final    BOTTLES DRAWN AEROBIC AND ANAEROBIC Blood Culture  adequate volume   Culture   Final    NO GROWTH 3 DAYS Performed at [facility], [address], [place], [place] [address]    Report Status PENDING  Incomplete  Blood Culture (routine x 2)     Status: None (Preliminary result)   Collection Time: 06/27/18  5:21 PM  Result Value Ref Range Status   Specimen Description SITE NOT SPECIFIED  Final   Special Requests   Final    BOTTLES DRAWN AEROBIC AND ANAEROBIC Blood Culture results may not be optimal due to an inadequate volume of blood received in culture bottles   Culture   Final    NO GROWTH 3 DAYS Performed at [facility], [address] [place], [place] [address]    Report Status PENDING  Incomplete  Culture, Urine     Status: None   Collection Time: 06/28/18  1:09 AM  Result Value Ref Range Status   Specimen Description URINE, CLEAN CATCH  Final   Special Requests NONE  Final   Culture   Final    NO GROWTH Performed at [facility], [address] [place],  [place] [address]    Report Status 06/29/2018 FINAL  Final  MRSA PCR Screening     Status: None   Collection Time: 06/28/18  4:09 AM  Result Value Ref Range Status   MRSA by PCR NEGATIVE NEGATIVE Final    Comment:        The GeneXpert MRSA Assay (FDA approved for NASAL specimens only), is one component of a comprehensive MRSA colonization surveillance program. It is not intended to diagnose MRSA infection nor to guide or monitor treatment for MRSA infections. Performed at [facility], [address], [place], [place] [address]          Radiology Studies: Dg Chest 2 View  Result Date: 07/01/2018 CLINICAL DATA:  Hypoxia. EXAM: CHEST - 2 VIEW COMPARISON:  06/29/2018.  CT 06/27/2018. FINDINGS: Cardiomegaly with diffuse bilateral pulmonary infiltrates/edema. Interim improvement in aeration from prior study of 06/29/2018. No pleural effusion or pneumothorax. IMPRESSION: Cardiomegaly with diffuse bilateral pulmonary infiltrates/edema again noted. Interim improvement in aeration from prior study 06/29/2018. Electronically Signed   By: Maisie Fus  Register   On: 07/01/2018 09:55        Scheduled Meds: . benztropine  0.5 mg Oral QHS  . cefdinir  300 mg Oral Q12H  . divalproex  250 mg Oral Q12H  . enoxaparin (LOVENOX) injection  0.5 mg/kg Subcutaneous Q24H  . furosemide  40 mg Intravenous BID  . haloperidol  2 mg Oral BID  . LORazepam  0.5 mg Oral BID  . mirtazapine  30 mg Oral QHS  . QUEtiapine  200 mg Oral BID   Continuous Infusions:    LOS: 4 days    Time spent:   Zannie Cove, MD [facility] Page via [email or web address], password [email or web address] After 7PM please contact night-coverage  07/01/2018, 1:41 PM

## 2018-07-01 NOTE — Evaluation (Signed)
Physical Therapy Evaluation Patient Details Name: Glenn [name] MRN: [id number] DOB: 09-06-1985 Today's Date: 07/01/2018   History of Present Illness  32yo male send to the ED by his adult daycare due to rigors and HR up to 130BPM. Family also reports fever, cough, diarrhea. Admitted for sepsis. PMH autism, MR   Clinical Impression   Patient received in bed with family present, mother provided all PLOF/equipment history; per family, Yael loves to walk and paces through the house all day long, is fairly impulsive at baseline. He requires multimodal and repetitive cues due to impulsivity. Able to complete bed mobility with mod(I) and requires Min guard for transfers and gait approximately 15f in hallway. Initially attempted RW due to possible weakness but patient unsafe with device, playing with RW instead of using it appropriately and is reasonably steady on his feet once OOB. Patient very impulsive with mobility and due to impulsivity O2 became disconnected from tank during gait with patient then walking on room air, SpO2 drop to 82% and recovered to 92% immediately with return to 4LPM O2, RN aware. Per family he is at his baseline level in terms of attention and impulsivity/safety awareness. He was left in bed with all needs met and family present, bed alarm active, and RN present and attending. He will continue to benefit from skilled PT services in the acute setting however do not anticipate need for skilled PT follow up after DC.     Follow Up Recommendations No PT follow up    Equipment Recommendations  None recommended by PT    Recommendations for Other Services       Precautions / Restrictions Precautions Precautions: Fall;Other (comment) Precaution Comments: very impulsive  Restrictions Weight Bearing Restrictions: No      Mobility  Bed Mobility Overal bed mobility: Modified Independent             General bed mobility comments: Mod(I), cue for safety due to  impulsivity   Transfers Overall transfer level: Needs assistance Equipment used: None Transfers: Sit to/from Stand Sit to Stand: Min guard;From elevated surface         General transfer comment: min guard for safety from elevated surface, did have one time LOB when standing from lower surface   Ambulation/Gait Ambulation/Gait assistance: Min guard Gait Distance (Feet): 150 Feet Assistive device: None Gait Pattern/deviations: Step-through pattern;WFL(Within Functional Limits)     General Gait Details: gait initially with RW but patient then flipped RW backwards and attempted to use it, clearly did not need AD for balance with gait; able to ambulate in hallway with min guard and cues for safety due to impulsivity   Stairs            Wheelchair Mobility    Modified Rankin (Stroke Patients Only)       Balance Overall balance assessment: Mild deficits observed, not formally tested                                           Pertinent Vitals/Pain Pain Assessment: No/denies pain Faces Pain Scale: No hurt    Home Living Family/patient expects to be discharged to:: Private residence Living Arrangements: Parent Available Help at Discharge: Family;Available 24 hours/day Type of Home: House Home Access: Level entry     Home Layout: One level Home Equipment: None Additional Comments: loves to walk, per parents all he does is pace the  house all day     Prior Function Level of Independence: Needs assistance         Comments: needs 24/7 S and care due to MR/autism      Hand Dominance        Extremity/Trunk Assessment   Upper Extremity Assessment Upper Extremity Assessment: Overall WFL for tasks assessed    Lower Extremity Assessment Lower Extremity Assessment: Overall WFL for tasks assessed    Cervical / Trunk Assessment Cervical / Trunk Assessment: Normal  Communication   Communication: Other (comment);Expressive  difficulties(non-verbal )  Cognition Arousal/Alertness: Awake/alert Behavior During Therapy: Impulsive Overall Cognitive Status: History of cognitive impairments - at baseline                                 General Comments: severe autism/MR at baseline, non-verbal       General Comments      Exercises     Assessment/Plan    PT Assessment Patient needs continued PT services  PT Problem List Decreased safety awareness;Decreased balance;Cardiopulmonary status limiting activity;Decreased coordination       PT Treatment Interventions DME instruction;Balance training;Neuromuscular re-education;Gait training;Stair training;Functional mobility training;Patient/family education;Therapeutic activities;Therapeutic exercise    PT Goals (Current goals can be found in the Care Plan section)  Acute Rehab PT Goals Patient Stated Goal: go home  PT Goal Formulation: With family Time For Goal Achievement: 07/15/18 Potential to Achieve Goals: Good    Frequency Min 3X/week   Barriers to discharge        Co-evaluation               AM-PAC PT "6 Clicks" Daily Activity  Outcome Measure Difficulty turning over in bed (including adjusting bedclothes, sheets and blankets)?: None Difficulty moving from lying on back to sitting on the side of the bed? : None Difficulty sitting down on and standing up from a chair with arms (e.g., wheelchair, bedside commode, etc,.)?: A Little Help needed moving to and from a bed to chair (including a wheelchair)?: A Little Help needed walking in hospital room?: A Little Help needed climbing 3-5 steps with a railing? : A Little 6 Click Score: 20    End of Session Equipment Utilized During Treatment: Gait belt Activity Tolerance: Patient tolerated treatment well Patient left: in bed;with call bell/phone within reach;with bed alarm set;with family/visitor present Nurse Communication: Mobility status;Other (comment)(SpO2 on room air ) PT  Visit Diagnosis: Unsteadiness on feet (R26.81);Other abnormalities of gait and mobility (R26.89)    Time: 5498-2641 PT Time Calculation (min) (ACUTE ONLY): 19 min   Charges:   PT Evaluation $PT Eval Moderate Complexity: 1 Mod          Deniece Ree PT, DPT, CBIS  Supplemental Physical Therapist [place]    Pager [phone] Acute Rehab Office [phone]

## 2018-07-02 ENCOUNTER — Other Ambulatory Visit (HOSPITAL_COMMUNITY): Payer: Self-pay

## 2018-07-02 LAB — CULTURE, BLOOD (ROUTINE X 2)
Culture: NO GROWTH
Culture: NO GROWTH
Special Requests: ADEQUATE

## 2018-07-02 MED ORDER — METFORMIN HCL 500 MG PO TABS: 500.0000 mg | ORAL_TABLET | Freq: Two times a day (BID) | ORAL | 0 refills | Status: DC

## 2018-07-02 MED ORDER — FUROSEMIDE 80 MG PO TABS: 80.0000 mg | ORAL_TABLET | Freq: Two times a day (BID) | ORAL | 0 refills | Status: DC

## 2018-07-02 MED ORDER — CEFDINIR 300 MG PO CAPS: 300.0000 mg | ORAL_CAPSULE | Freq: Two times a day (BID) | ORAL | 0 refills | Status: AC

## 2018-07-02 MED ORDER — FUROSEMIDE 80 MG PO TABS
80.0000 mg | ORAL_TABLET | Freq: Two times a day (BID) | ORAL | Status: DC
  Administered 2018-07-02: 80 mg via ORAL
  Filled 2018-07-02: qty 1

## 2018-07-02 MED FILL — metFORMIN HCL 500 MG TABS: 500 | 30 days supply | Qty: 60 | Fill #0

## 2018-07-02 MED FILL — CEFDINIR 300 MG CAPSULE: 300 | 1 days supply | Qty: 2 | Fill #0

## 2018-07-02 MED FILL — FUROSEMIDE 80 MG TAB: 80 | 4 days supply | Qty: 8 | Fill #0

## 2018-07-02 NOTE — Care Management Note (Signed)
Case Management Note  Patient Details  Name: Glenn DawleyBrandon C Hornung MRN: [id number] Date of Birth: 06/27/1986  Subjective/Objective:  From home with parent, he has MR, he is a ward of the state,  presented with Pna, acute resp failure, aki, patient is nonverbal.  He is for dc today, will need HHRN for medication management to help mom understand more about the diabetes.  NCM offered choice to [facility], [facility], mom states her phone number is [phone].  He does not need home oxgen.                   Action/Plan: DC home when ready.  Expected Discharge Date:  07/02/18               Expected Discharge Plan:  Home w Home Health Services  In-House Referral:     Discharge planning Services  CM Consult  Post Acute Care Choice:  Home Health Choice offered to:  Parent  DME Arranged:    DME Agency:     HH Arranged:  RN HH Agency:  [facility]  Status of Service:  Completed, signed off  If discussed at Long Length of Stay Meetings, dates discussed:    Additional Comments:  Leone Havenaylor, Vandora Jaskulski [place], RN 07/02/2018, 12:00 PM

## 2018-07-02 NOTE — Progress Notes (Signed)
SATURATION QUALIFICATIONS: (This note is used to comply with regulatory documentation for home oxygen)  Patient Saturations on Room Air at Rest = 95%  Patient Saturations on Room Air while Ambulating = 92%  Patient Saturations on 2 Liters of oxygen while Ambulating = 97%  Please briefly explain why patient needs home oxygen:  Okay to go home with oxygen

## 2018-07-02 NOTE — Progress Notes (Signed)
Pt seen by MD, orders written for discharge. RN went over discharge education with pt and his mother. Pt escorted for discharge via wheelchair with all belongings. Transitional pharmacy delivered new medications to pt and mother prior to them leaving. Pt will follow up outpatient with MD.

## 2018-07-04 NOTE — Discharge Summary (Signed)
[facility] Discharge Summary   Patient: Glenn [name] ZOX:[id number]   PCP: [facility], [facility] DOB: 09-07-1985   Date of admission: 06/27/2018   Date of discharge: 07/02/2018    Discharge Diagnoses:  Principal diagnosis Sepsis secondary to pyelonephritis and pneumonia. Principal Problem:   Sepsis (HCC) Active Problems:   Autism   Renal insufficiency  Admitted From: home Disposition:  Home with home health  Recommendations for Outpatient Follow-up:  1. Please follow up with PCP in 1 week   Follow-up Information    Medicine, [facility]. Schedule an appointment as soon as possible for a visit on 07/04/2018.   Specialty:  Family Medicine Why:  2:30 for follow up Contact information: [address] [place] [address] [phone]        [facility], [facility] Follow up.   Specialty:  Home Health Services Why:  [facility] Contact information: [address] [place] [address] [phone]          Diet recommendation: Cardiac diet  Activity: The patient is advised to gradually reintroduce usual activities.  Discharge Condition: good  Code Status: Full code  History of present illness: As per the H and P dictated on admission, "Glenn [name] is a 32 y.o. 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  Patient apparently with a couple day history of diarrhea and cough.  School noted patient had rigors and HR 130 today.  Sent in to ED.  Sounds like he may have been having some fever, diarrhea, cough symptoms for past 2-3 days per family.   ED Course: Tm 103.x, Tachy 120 after 2L NS bolus, lactate 0.94, WBC 11k.  CXR neg, UA still pending.  Tachypnea 35-40, O2 sat 92% RA.  Fever persists despite 2 rounds of 1gm tylenol x4 hours apart."  Hospital Course:  Summary of his active problems in the hospital is as following. Severe sepsis  -Present on admission  -Suspected to be secondary to  pyelonephritis, repeat CXR with concern for PNA vs atelactasis too -CT abdomen pelvis noted bilateral perinephric stranding right greater than left and suggested Pyelonephritis -Remained febrile for 48 hours and patient finally improved slowly -CT abdomen was concerning for pyelonephritis however urine culture came back negative, and repeat x-ray did raise concern for pneumonia although I suspect this is primarily fluid overload from aggressive fluid resuscitation for severe sepsis -sepsis physiology has resolved -Treated with IV antibiotics, cefdinir to cover urinary and pulmonary source -Patient has severe mental retardation, nonverbal at baseline hence unable to assist with history/subjective complaints -ambulate, Pt eval  Acute hypoxic respiratory failure  -volume overload most likely and ? infiltrates -Likely iatrogenic from aggressive fluid resuscitation for hypotension/sepsis Treated with IV Lasix.  Transition to oral Lasix for a few days.  Severe autism/mental retardation -Nonverbal at baseline -Resume home medications -seroquel at lower dose, haldol, cogentin, remeron, -continue depakote, change to PO -mental status at baseline now, smiles, child like mannerisms  Acute kidney injury secondary to sepsis -Improved  Diabetes mellitus, likely type II.  Newly diagnosed. -7.9 Hba1c We will start the patient on metformin.  Outpatient follow-up for further work-up.  All other chronic medical condition were stable during the hospitalization.  Patient was seen by physical therapy, who recommended home health, which was arranged by Child psychotherapist and case Production designer, theatre/television/film. On the day of the discharge the patient's vitals were stable , and no other acute medical condition were reported by patient. the patient was  felt safe to be discharge at home with home health.  Consultants: none Procedures: none  DISCHARGE MEDICATION: Allergies as of 07/02/2018   No Known Allergies     Medication  List    TAKE these medications   benztropine 0.5 MG tablet Commonly known as:  COGENTIN Take 0.5 mg by mouth at bedtime.   cetirizine 10 MG tablet Commonly known as:  ZYRTEC Take 1 tablet (10 mg total) by mouth daily. What changed:    when to take this  reasons to take this   divalproex 500 MG DR tablet Commonly known as:  DEPAKOTE Take 250 mg by mouth 2 (two) times daily.   furosemide 80 MG tablet Commonly known as:  LASIX Take 1 tablet (80 mg total) by mouth 2 (two) times daily for 4 days.   haloperidol 5 MG tablet Commonly known as:  HALDOL Take 2.5 mg by mouth 2 (two) times daily.   hydrOXYzine 50 MG capsule Commonly known as:  VISTARIL Take 100 mg by mouth at bedtime.   LORazepam 0.5 MG tablet Commonly known as:  ATIVAN Take 0.5 mg by mouth every 8 (eight) hours.   metFORMIN 500 MG tablet Commonly known as:  GLUCOPHAGE Take 1 tablet (500 mg total) by mouth 2 (two) times daily with a meal.   mirtazapine 45 MG tablet Commonly known as:  REMERON Take 45 mg by mouth at bedtime.   Olopatadine HCl 0.2 % Soln Apply 1 drop to eye daily. What changed:    how to take this  when to take this  reasons to take this   SEROQUEL 400 MG tablet Generic drug:  QUEtiapine Take 400 mg by mouth 2 (two) times daily.   zolpidem 5 MG tablet Commonly known as:  AMBIEN Take 5 mg by mouth at bedtime as needed for sleep. What changed:  Another medication with the same name was removed. Continue taking this medication, and follow the directions you see here.     ASK your doctor about these medications   cefdinir 300 MG capsule Commonly known as:  OMNICEF Take 1 capsule (300 mg total) by mouth 2 (two) times daily for 1 day. Ask about: Should I take this medication?      No Known Allergies Discharge Instructions    Diet - low sodium heart healthy   Complete by:  As directed    Increase activity slowly   Complete by:  As directed      Discharge Exam: Filed Weights    06/27/18 1443 06/28/18 0401  Weight: (!) 145.2 kg (!) 150.1 kg   Vitals:   07/02/18 0200 07/02/18 0733  BP:  124/74  Pulse:  97  Resp: (!) 22   Temp:  99.1 F (37.3 C)  SpO2:  95%   General: Appear in no distress, no Rash; Oral Mucosa moist. Cardiovascular: S1 and S2 Present, no Murmur, no JVD Respiratory: Bilateral Air entry present and Clear to Auscultation, no Crackles, no wheezes Abdomen: Bowel Sound present, Soft and no tenderness Extremities: no Pedal edema, no calf tenderness Neurology: Grossly no focal neuro deficit.  The results of significant diagnostics from this hospitalization (including imaging, microbiology, ancillary and laboratory) are listed below for reference.    Significant Diagnostic Studies: Dg Chest 2 View  Result Date: 07/01/2018 CLINICAL DATA:  Hypoxia. EXAM: CHEST - 2 VIEW COMPARISON:  06/29/2018.  CT 06/27/2018. FINDINGS: Cardiomegaly with diffuse bilateral pulmonary infiltrates/edema. Interim improvement in aeration from prior study of 06/29/2018. No pleural effusion or pneumothorax.  IMPRESSION: Cardiomegaly with diffuse bilateral pulmonary infiltrates/edema again noted. Interim improvement in aeration from prior study 06/29/2018. Electronically Signed   By: Maisie Fus  Register   On: 07/01/2018 09:55   Ct Chest W Contrast  Result Date: 06/27/2018 CLINICAL DATA:  32 y/o  M; fever of unknown origin. EXAM: CT CHEST, ABDOMEN, AND PELVIS WITH CONTRAST TECHNIQUE: Multidetector CT imaging of the chest, abdomen and pelvis was performed following the standard protocol during bolus administration of intravenous contrast. CONTRAST:  OMNIPAQUE IOHEXOL 300 MG/ML  SOLN COMPARISON:  None. FINDINGS: CT CHEST FINDINGS Cardiovascular: No significant vascular findings. Normal heart size. No pericardial effusion. Mediastinum/Nodes: No enlarged mediastinal, hilar, or axillary lymph nodes. Thyroid gland, trachea, and esophagus demonstrate no significant findings.  Lungs/Pleura: Right greater than left lung base linear opacities compatible with platelike atelectasis. No consolidation, effusion, or pneumothorax. Musculoskeletal: No chest wall mass or suspicious bone lesions identified. CT ABDOMEN PELVIS FINDINGS Hepatobiliary: No focal liver abnormality is seen. No gallstones, gallbladder wall thickening, or biliary dilatation. Pancreas: Unremarkable. No pancreatic ductal dilatation or surrounding inflammatory changes. Spleen: Normal in size without focal abnormality. Adrenals/Urinary Tract: Normal adrenal glands. Patchy areas of hypoattenuation, perinephric stranding, and urothelial enhancement of the right greater than left kidneys. No hydronephrosis or urinary stone disease. Mild wall thickening of the bladder. Stomach/Bowel: Stomach is within normal limits. Appendix appears normal. No evidence of bowel wall thickening, distention, or inflammatory changes. Vascular/Lymphatic: No significant vascular findings are present. No enlarged abdominal or pelvic lymph nodes. Reproductive: Prostate is unremarkable. Other: No abdominal wall hernia or abnormality. No abdominopelvic ascites. Musculoskeletal: No acute or significant osseous findings. IMPRESSION: Patchy areas of hypoattenuation, perinephric stranding, and urothelial enhancement of the right greater than left kidneys, consistent with acute pyelonephritis. Electronically Signed   By: Mitzi Hansen M.D.   On: 06/27/2018 22:12   Ct Abdomen Pelvis W Contrast  Result Date: 06/27/2018 CLINICAL DATA:  32 y/o  M; fever of unknown origin. EXAM: CT CHEST, ABDOMEN, AND PELVIS WITH CONTRAST TECHNIQUE: Multidetector CT imaging of the chest, abdomen and pelvis was performed following the standard protocol during bolus administration of intravenous contrast. CONTRAST:  OMNIPAQUE IOHEXOL 300 MG/ML  SOLN COMPARISON:  None. FINDINGS: CT CHEST FINDINGS Cardiovascular: No significant vascular findings. Normal heart size.  No pericardial effusion. Mediastinum/Nodes: No enlarged mediastinal, hilar, or axillary lymph nodes. Thyroid gland, trachea, and esophagus demonstrate no significant findings. Lungs/Pleura: Right greater than left lung base linear opacities compatible with platelike atelectasis. No consolidation, effusion, or pneumothorax. Musculoskeletal: No chest wall mass or suspicious bone lesions identified. CT ABDOMEN PELVIS FINDINGS Hepatobiliary: No focal liver abnormality is seen. No gallstones, gallbladder wall thickening, or biliary dilatation. Pancreas: Unremarkable. No pancreatic ductal dilatation or surrounding inflammatory changes. Spleen: Normal in size without focal abnormality. Adrenals/Urinary Tract: Normal adrenal glands. Patchy areas of hypoattenuation, perinephric stranding, and urothelial enhancement of the right greater than left kidneys. No hydronephrosis or urinary stone disease. Mild wall thickening of the bladder. Stomach/Bowel: Stomach is within normal limits. Appendix appears normal. No evidence of bowel wall thickening, distention, or inflammatory changes. Vascular/Lymphatic: No significant vascular findings are present. No enlarged abdominal or pelvic lymph nodes. Reproductive: Prostate is unremarkable. Other: No abdominal wall hernia or abnormality. No abdominopelvic ascites. Musculoskeletal: No acute or significant osseous findings. IMPRESSION: Patchy areas of hypoattenuation, perinephric stranding, and urothelial enhancement of the right greater than left kidneys, consistent with acute pyelonephritis. Electronically Signed   By: Mitzi Hansen M.D.   On: 06/27/2018 22:12   Dg  Chest Port 1 View  Result Date: 06/29/2018 CLINICAL DATA:  Hypoxia. EXAM: PORTABLE CHEST 1 VIEW COMPARISON:  Chest x-ray and CT chest from yesterday. FINDINGS: The heart size and mediastinal contours are within normal limits. Persistent low lung volumes with new patchy bilateral mid to lower lung airspace  disease and interstitial thickening. No large pleural effusion. No pneumothorax. No acute osseous abnormality. IMPRESSION: 1. New bilateral mid to lower lung airspace disease and interstitial thickening, which could reflect pulmonary edema or aspiration/pneumonia. Electronically Signed   By: Obie DredgeWilliam T Derry M.D.   On: 06/29/2018 09:02   Dg Chest Port 1 View  Result Date: 06/27/2018 CLINICAL DATA:  Fever EXAM: PORTABLE CHEST 1 VIEW COMPARISON:  None. FINDINGS: Degree of inspiration is shallow. There is no appreciable edema or consolidation. There is mild bibasilar atelectasis. Heart is mildly enlarged with pulmonary vascularity normal. No adenopathy. No bone lesions. There is rightward deviation of the upper thoracic trachea. IMPRESSION: Shallow inspiration with bibasilar atelectasis. No frank consolidation. Heart prominent. Rightward deviation of the upper thoracic trachea. Question enlarged thyroid. Electronically Signed   By: Bretta BangWilliam  Woodruff III M.D.   On: 06/27/2018 16:23    Microbiology: Recent Results (from the past 240 hour(s))  Blood Culture (routine x 2)     Status: None   Collection Time: 06/27/18  4:51 PM  Result Value Ref Range Status   Specimen Description BLOOD SITE NOT SPECIFIED  Final   Special Requests   Final    BOTTLES DRAWN AEROBIC AND ANAEROBIC Blood Culture adequate volume   Culture   Final    NO GROWTH 5 DAYS Performed at [facility], [address] [place], [place] [address]    Report Status 07/02/2018 FINAL  Final  Blood Culture (routine x 2)     Status: None   Collection Time: 06/27/18  5:21 PM  Result Value Ref Range Status   Specimen Description SITE NOT SPECIFIED  Final   Special Requests   Final    BOTTLES DRAWN AEROBIC AND ANAEROBIC Blood Culture results may not be optimal due to an inadequate volume of blood received in culture bottles   Culture   Final    NO GROWTH 5 DAYS Performed at [facility], [address] [place], [place]  [address]    Report Status 07/02/2018 FINAL  Final  Culture, Urine     Status: None   Collection Time: 06/28/18  1:09 AM  Result Value Ref Range Status   Specimen Description URINE, CLEAN CATCH  Final   Special Requests NONE  Final   Culture   Final    NO GROWTH Performed at [facility], [address] [place], [place] [address]    Report Status 06/29/2018 FINAL  Final  MRSA PCR Screening     Status: None   Collection Time: 06/28/18  4:09 AM  Result Value Ref Range Status   MRSA by PCR NEGATIVE NEGATIVE Final    Comment:        The GeneXpert MRSA Assay (FDA approved for NASAL specimens only), is one component of a comprehensive MRSA colonization surveillance program. It is not intended to diagnose MRSA infection nor to guide or monitor treatment for MRSA infections. Performed at [facility], [address] [place], [place] [address]      Labs: CBC: Recent Labs  Lab 06/27/18 1651 06/28/18 0444 07/01/18 0500  WBC 11.0* 10.0 7.9  NEUTROABS 7.8*  --   --  HGB 10.9* 9.5* 11.2*  HCT 33.7* 29.5* 35.3*  MCV 85.8 85.3 85.3  PLT 240 252 381   Basic Metabolic Panel: Recent Labs  Lab 06/27/18 1651 06/28/18 0444 07/01/18 0500  NA 131* 136 138  K 3.7 4.2 4.4  CL 96* 105 101  CO2 21* 21* 27  GLUCOSE 110* 45* 133*  BUN 11 10 14   CREATININE 1.54* 0.65 1.15  CALCIUM 8.7* 8.1* 9.1   Liver Function Tests: Recent Labs  Lab 06/27/18 1651 06/28/18 0444  AST 37 39  ALT 32 33  ALKPHOS 46 48  BILITOT 0.7 0.2*  PROT 7.2 6.5  ALBUMIN 3.0* 2.5*   No results for input(s): LIPASE, AMYLASE in the last 168 hours. No results for input(s): AMMONIA in the last 168 hours. Cardiac Enzymes: Recent Labs  Lab 06/28/18 0730  CKTOTAL 80   BNP (last 3 results) No results for input(s): BNP in the last 8760 hours. CBG: Recent Labs  Lab 06/28/18 0703  GLUCAP 163*   Time spent: 35 minutes  Signed:  Lynden Oxford  [facility] 07/02/2018 , 2:58 PM

## 2019-11-13 IMAGING — DX DG CHEST 1V PORT
1 series · 1 of 1 positions shown · non-contrast
Comparison: None.

CLINICAL DATA: Fever

EXAM:
PORTABLE CHEST 1 VIEW

[chest]
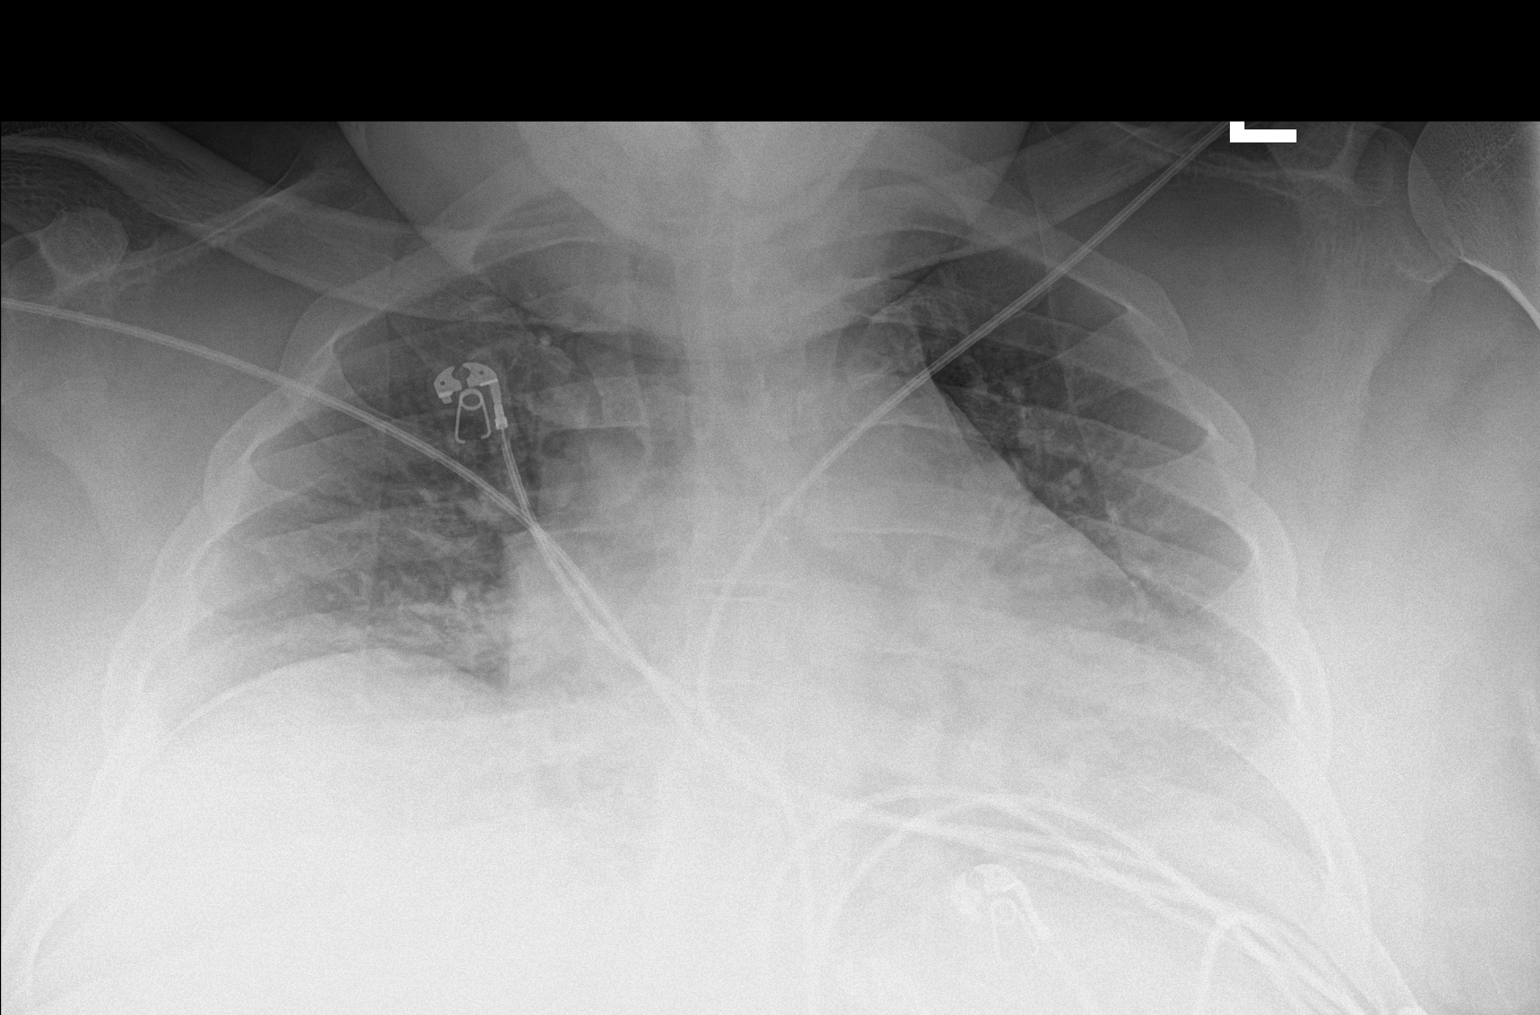

[1 of 1 positions shown; findings below may reference images not displayed]

FINDINGS: Degree of inspiration is shallow. There is no appreciable edema or
consolidation. There is mild bibasilar atelectasis. Heart is mildly
enlarged with pulmonary vascularity normal. No adenopathy. No bone
lesions. There is rightward deviation of the upper thoracic trachea.
IMPRESSION: Shallow inspiration with bibasilar atelectasis. No frank
consolidation. Heart prominent. Rightward deviation of the upper
thoracic trachea. Question enlarged thyroid.

## 2019-11-13 IMAGING — CT CT ABD-PELV W/ CM
2 of 5 series · 12 of 46 positions shown, 14 images · IV contrast (omnipaque)
Comparison: None.

CLINICAL DATA: 32 y/o  M; fever of unknown origin.

EXAM:
CT CHEST, ABDOMEN, AND PELVIS WITH CONTRAST
TECHNIQUE: Multidetector CT imaging of the chest, abdomen and pelvis was
performed following the standard protocol during bolus
administration of intravenous contrast.
CONTRAST:  125mL OMNIPAQUE IOHEXOL 300 MG/ML  SOLN

[Series 3: cap with · axial · 0.92mm/px · z∈[-648,-103]mm · 9 of 137 slices shown, 11 images]
[im 14/137  soft-tissue]
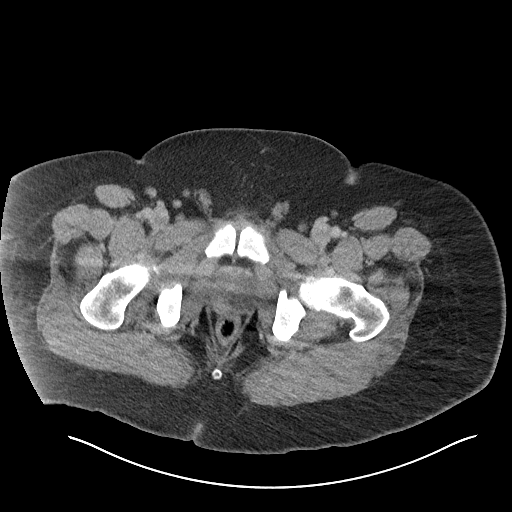
[im 14/137  bone]
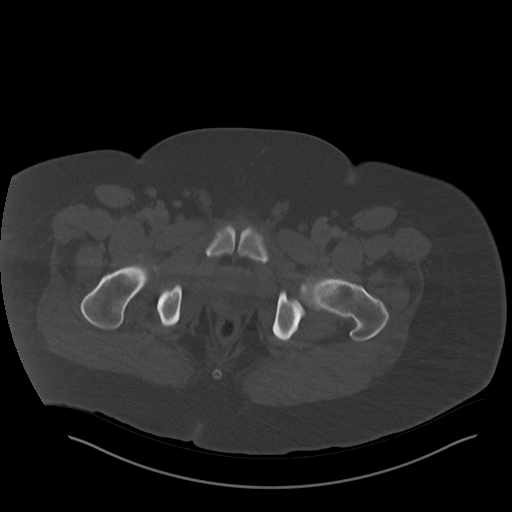
[im 28/137  soft-tissue]
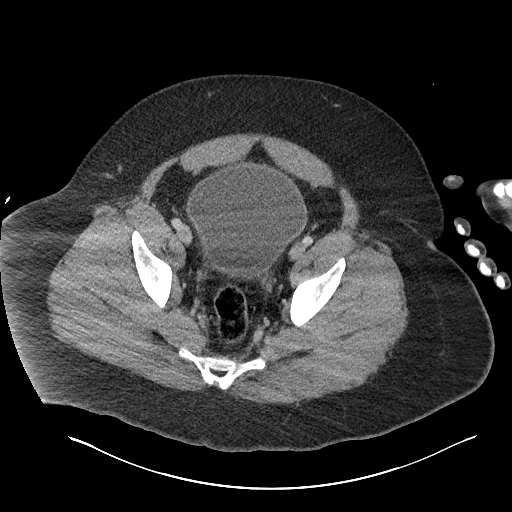
[im 41/137  soft-tissue]
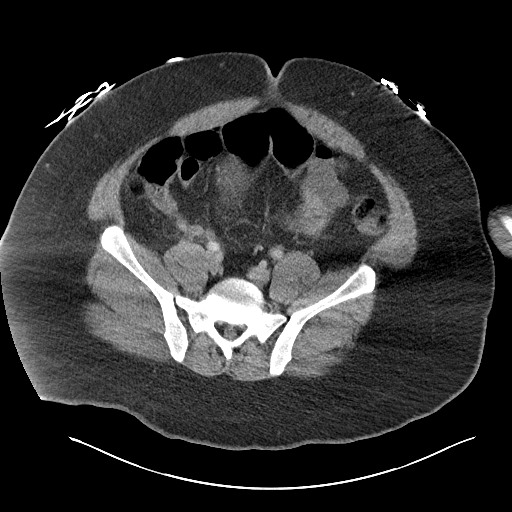
[im 55/137  soft-tissue]
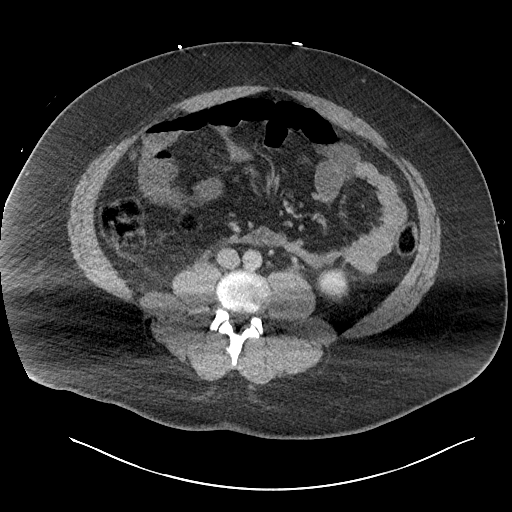
[im 69/137  soft-tissue]
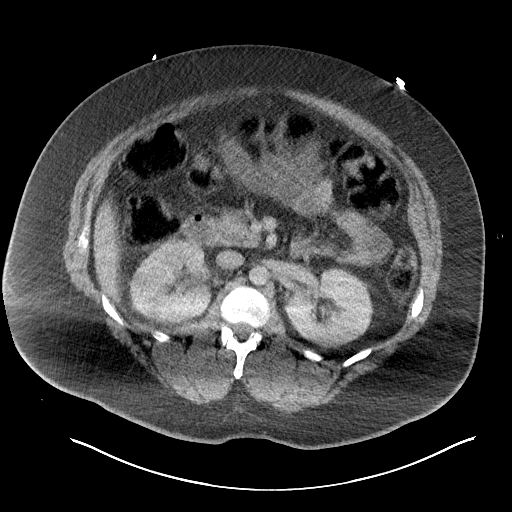
[im 82/137  soft-tissue]
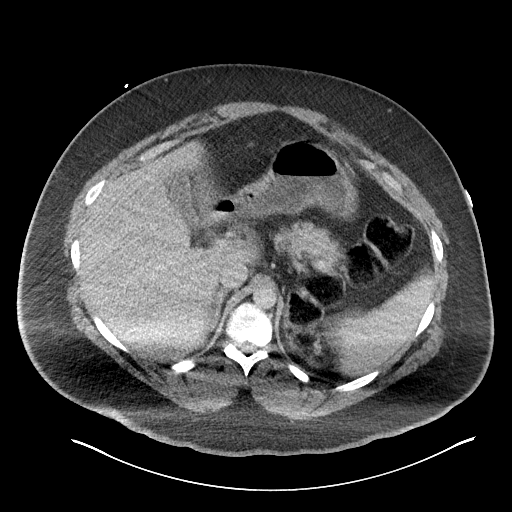
[im 96/137  soft-tissue]
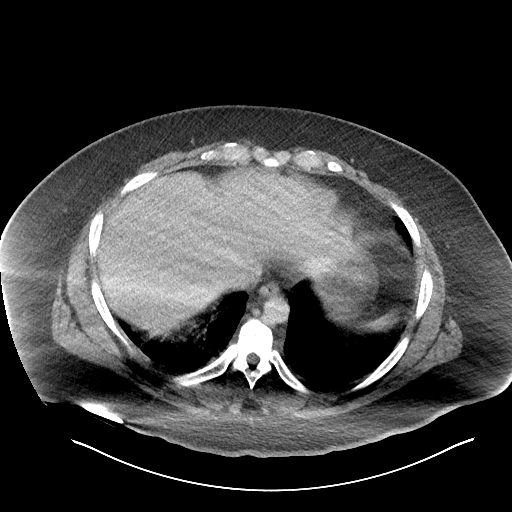
[im 109/137  soft-tissue]
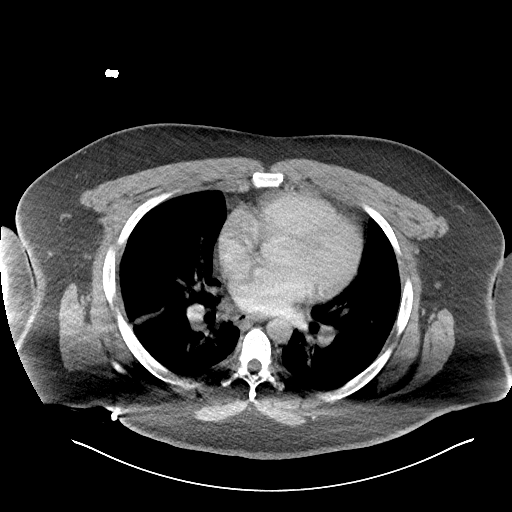
[im 123/137  soft-tissue]
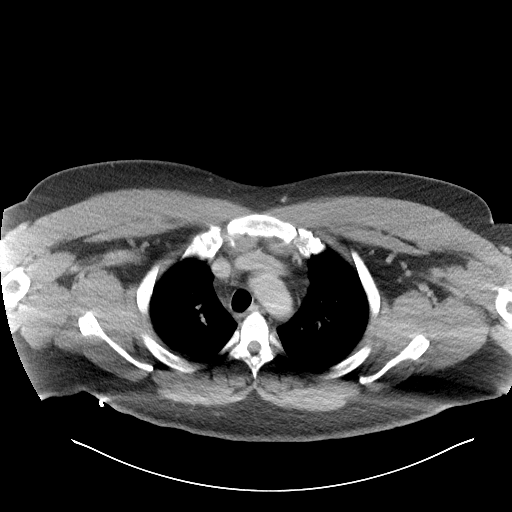
[im 123/137  bone]
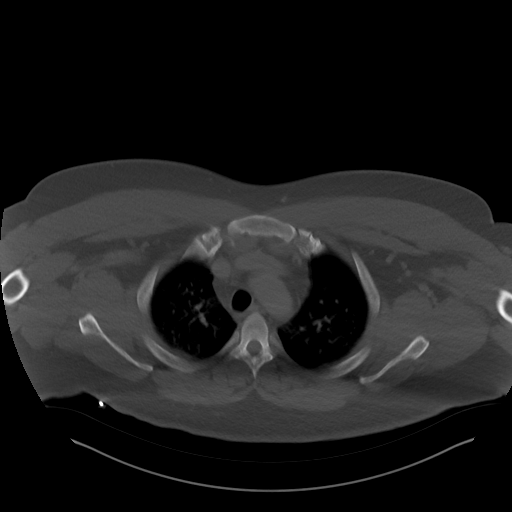

[Series 6: cor · coronal · 0.83mm/px · 3 of 108 slices shown]
[im 36/108  soft-tissue]
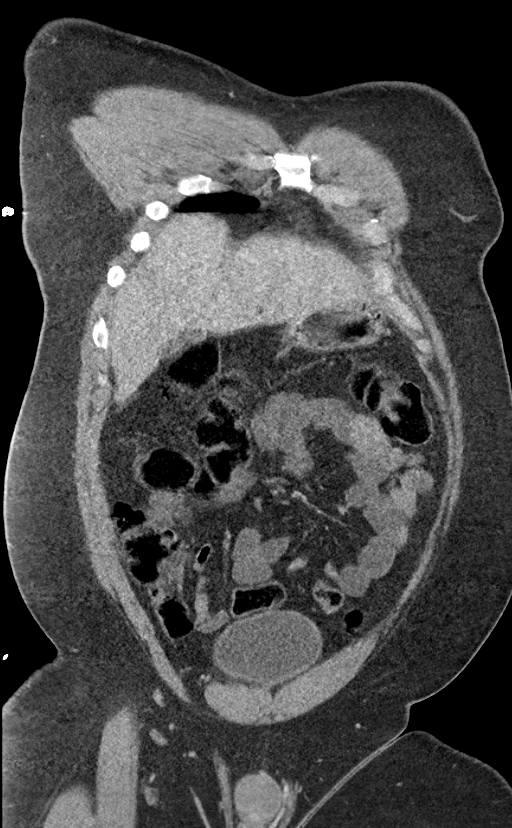
[im 48/108  soft-tissue]
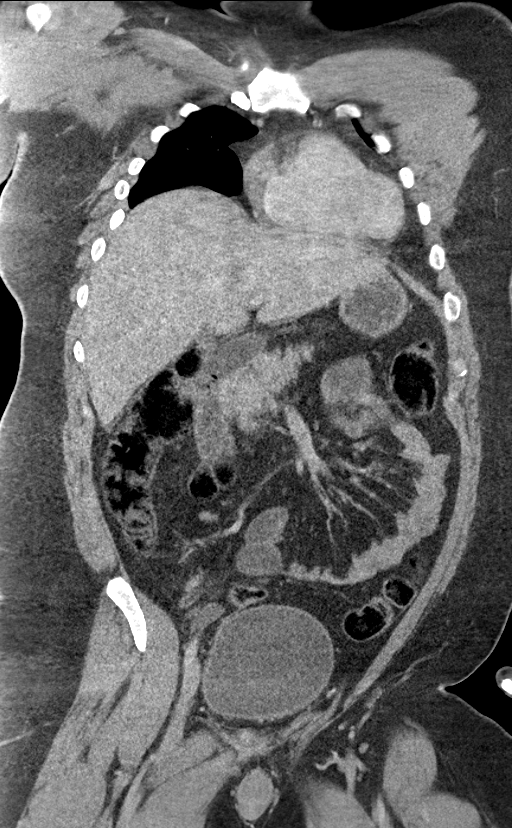
[im 60/108  soft-tissue]
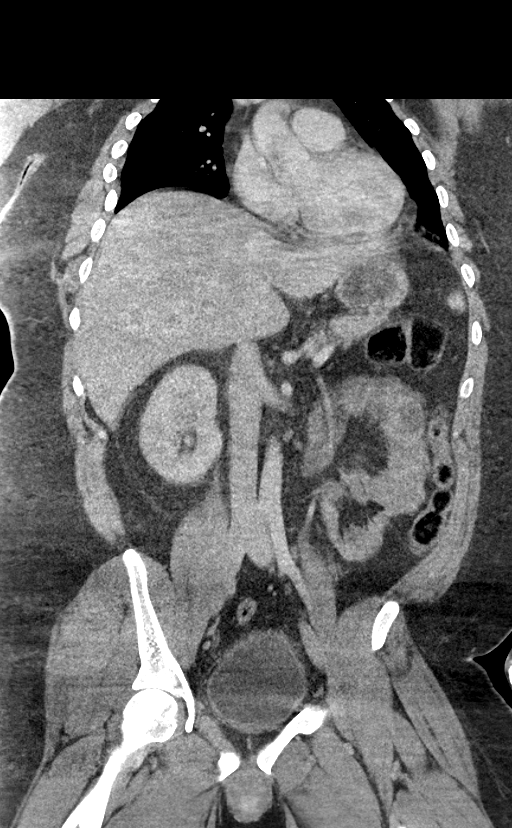

[12 of 46 positions shown; findings below may reference images not displayed]

FINDINGS: CT CHEST FINDINGS

Cardiovascular: No significant vascular findings. Normal heart size.
No pericardial effusion.

Mediastinum/Nodes: No enlarged mediastinal, hilar, or axillary lymph
nodes. Thyroid gland, trachea, and esophagus demonstrate no
significant findings.

Lungs/Pleura: Right greater than left lung base linear opacities
compatible with platelike atelectasis. No consolidation, effusion,
or pneumothorax.

Musculoskeletal: No chest wall mass or suspicious bone lesions
identified.

CT ABDOMEN PELVIS FINDINGS

Hepatobiliary: No focal liver abnormality is seen. No gallstones,
gallbladder wall thickening, or biliary dilatation.

Pancreas: Unremarkable. No pancreatic ductal dilatation or
surrounding inflammatory changes.

Spleen: Normal in size without focal abnormality.

Adrenals/Urinary Tract: Normal adrenal glands. Patchy areas of
hypoattenuation, perinephric stranding, and urothelial enhancement
of the right greater than left kidneys. No hydronephrosis or urinary
stone disease. Mild wall thickening of the bladder.

Stomach/Bowel: Stomach is within normal limits. Appendix appears
normal. No evidence of bowel wall thickening, distention, or
inflammatory changes.

Vascular/Lymphatic: No significant vascular findings are present. No
enlarged abdominal or pelvic lymph nodes.

Reproductive: Prostate is unremarkable.

Other: No abdominal wall hernia or abnormality. No abdominopelvic
ascites.

Musculoskeletal: No acute or significant osseous findings.
IMPRESSION: Patchy areas of hypoattenuation, perinephric stranding, and
urothelial enhancement of the right greater than left kidneys,
consistent with acute pyelonephritis.

## 2019-11-15 IMAGING — DX DG CHEST 1V PORT
1 series · 1 of 1 positions shown · non-contrast
Comparison: Chest x-ray and CT chest from yesterday.

CLINICAL DATA: Hypoxia.

EXAM:
PORTABLE CHEST 1 VIEW

[chest]
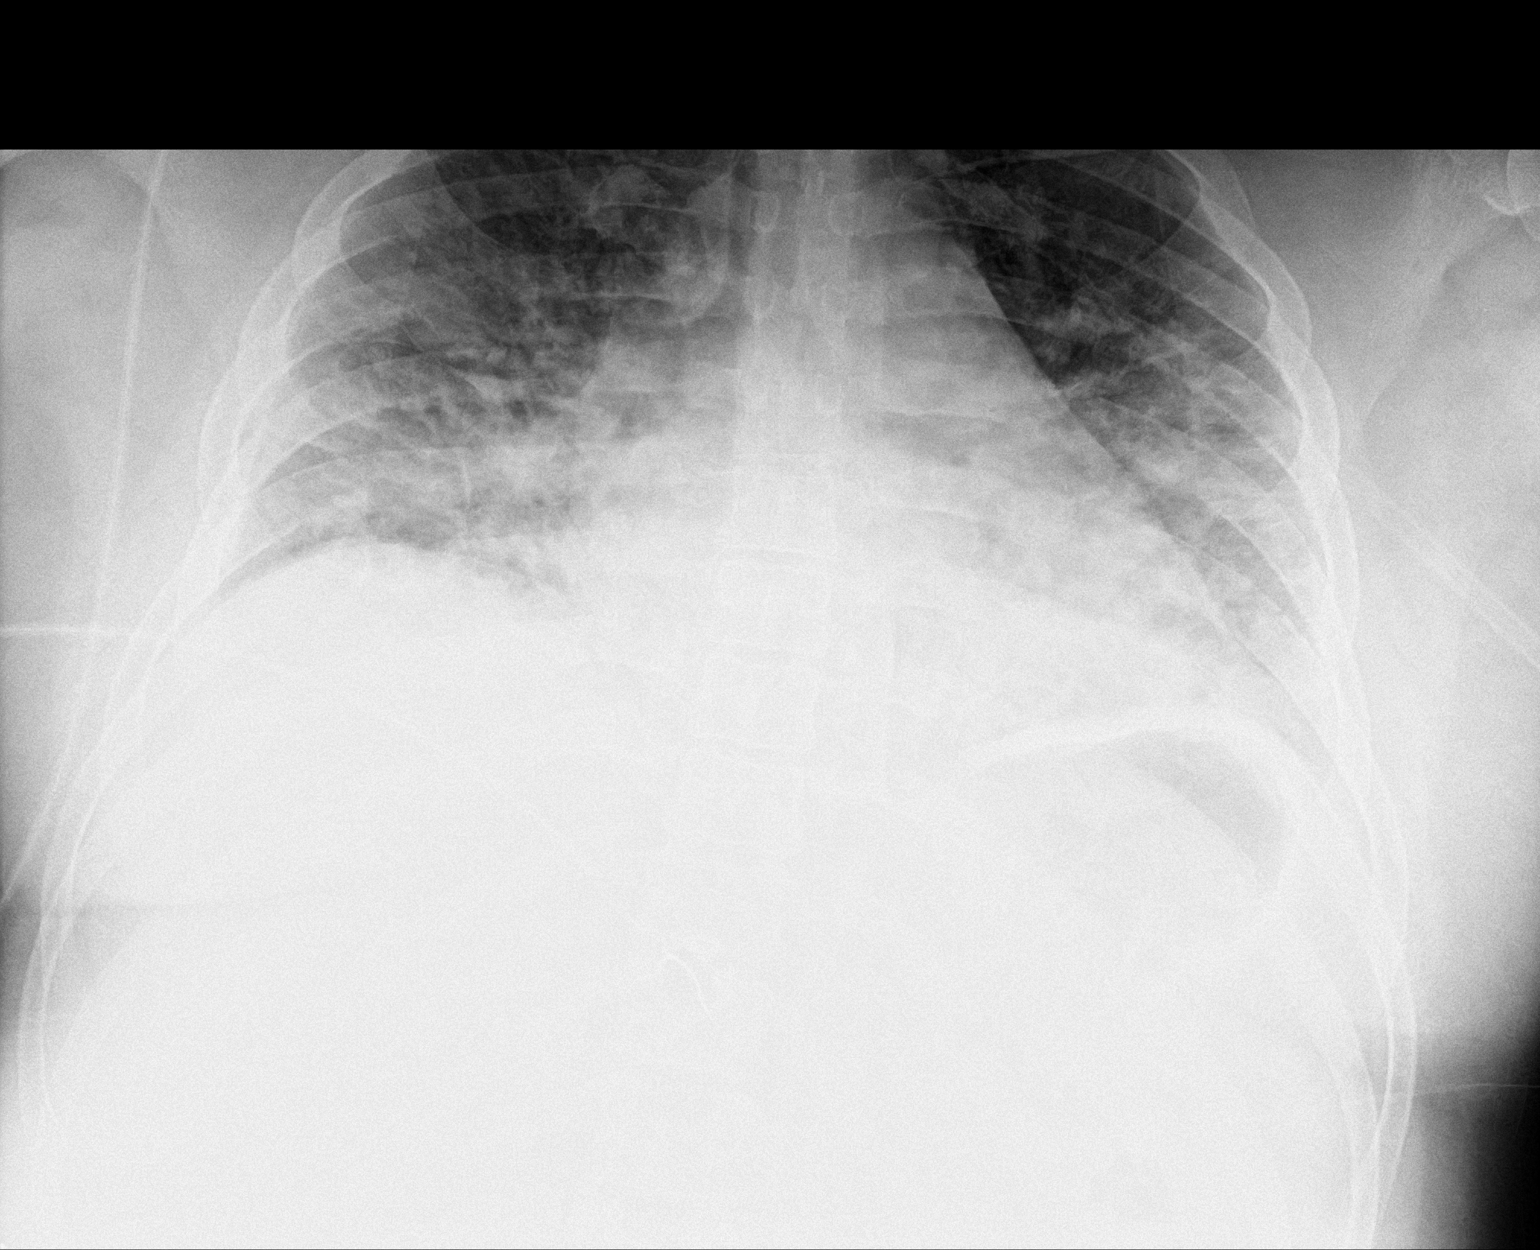

[1 of 1 positions shown; findings below may reference images not displayed]

FINDINGS: The heart size and mediastinal contours are within normal limits.
Persistent low lung volumes with new patchy bilateral mid to lower
lung airspace disease and interstitial thickening. No large pleural
effusion. No pneumothorax. No acute osseous abnormality.
IMPRESSION: 1. New bilateral mid to lower lung airspace disease and interstitial
thickening, which could reflect pulmonary edema or
aspiration/pneumonia.

## 2019-11-17 IMAGING — DX DG CHEST 2V
2 series · 2 of 2 positions shown · non-contrast
Comparison: 06/29/2018.  CT 06/27/2018.

CLINICAL DATA: Hypoxia.

EXAM:
CHEST - 2 VIEW

[x chest ap]
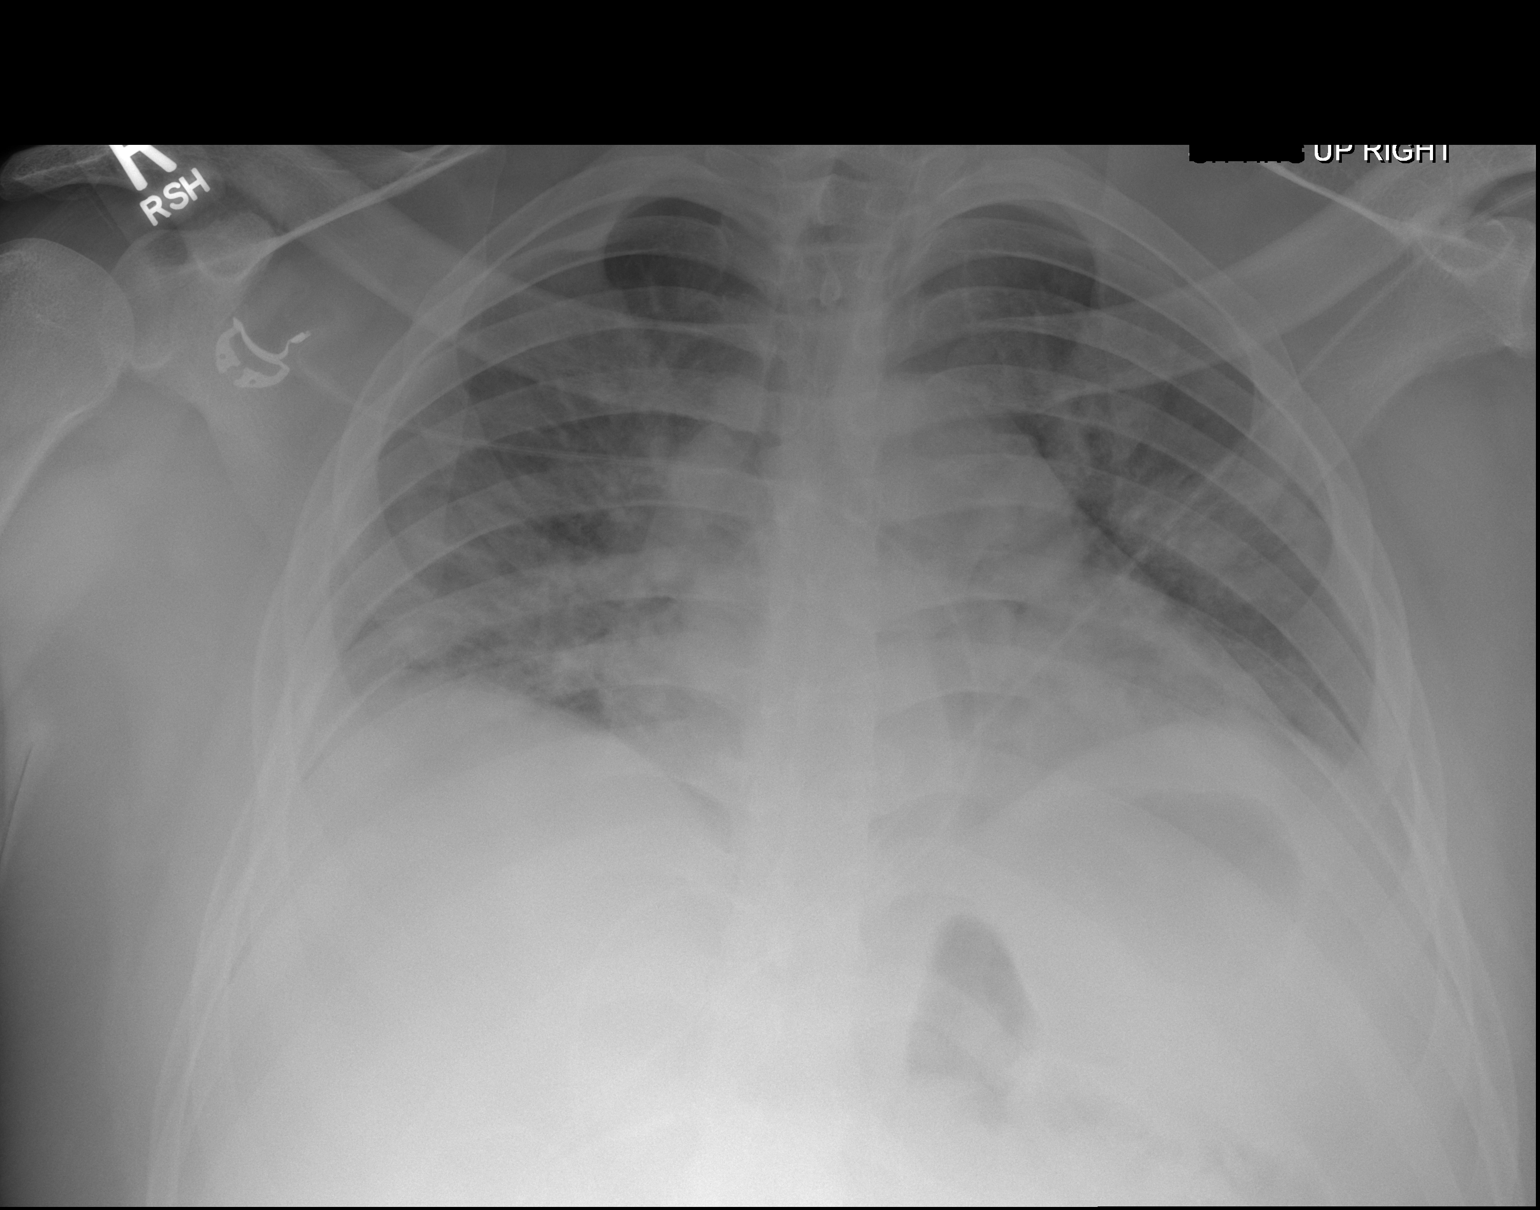

[w chest lat]
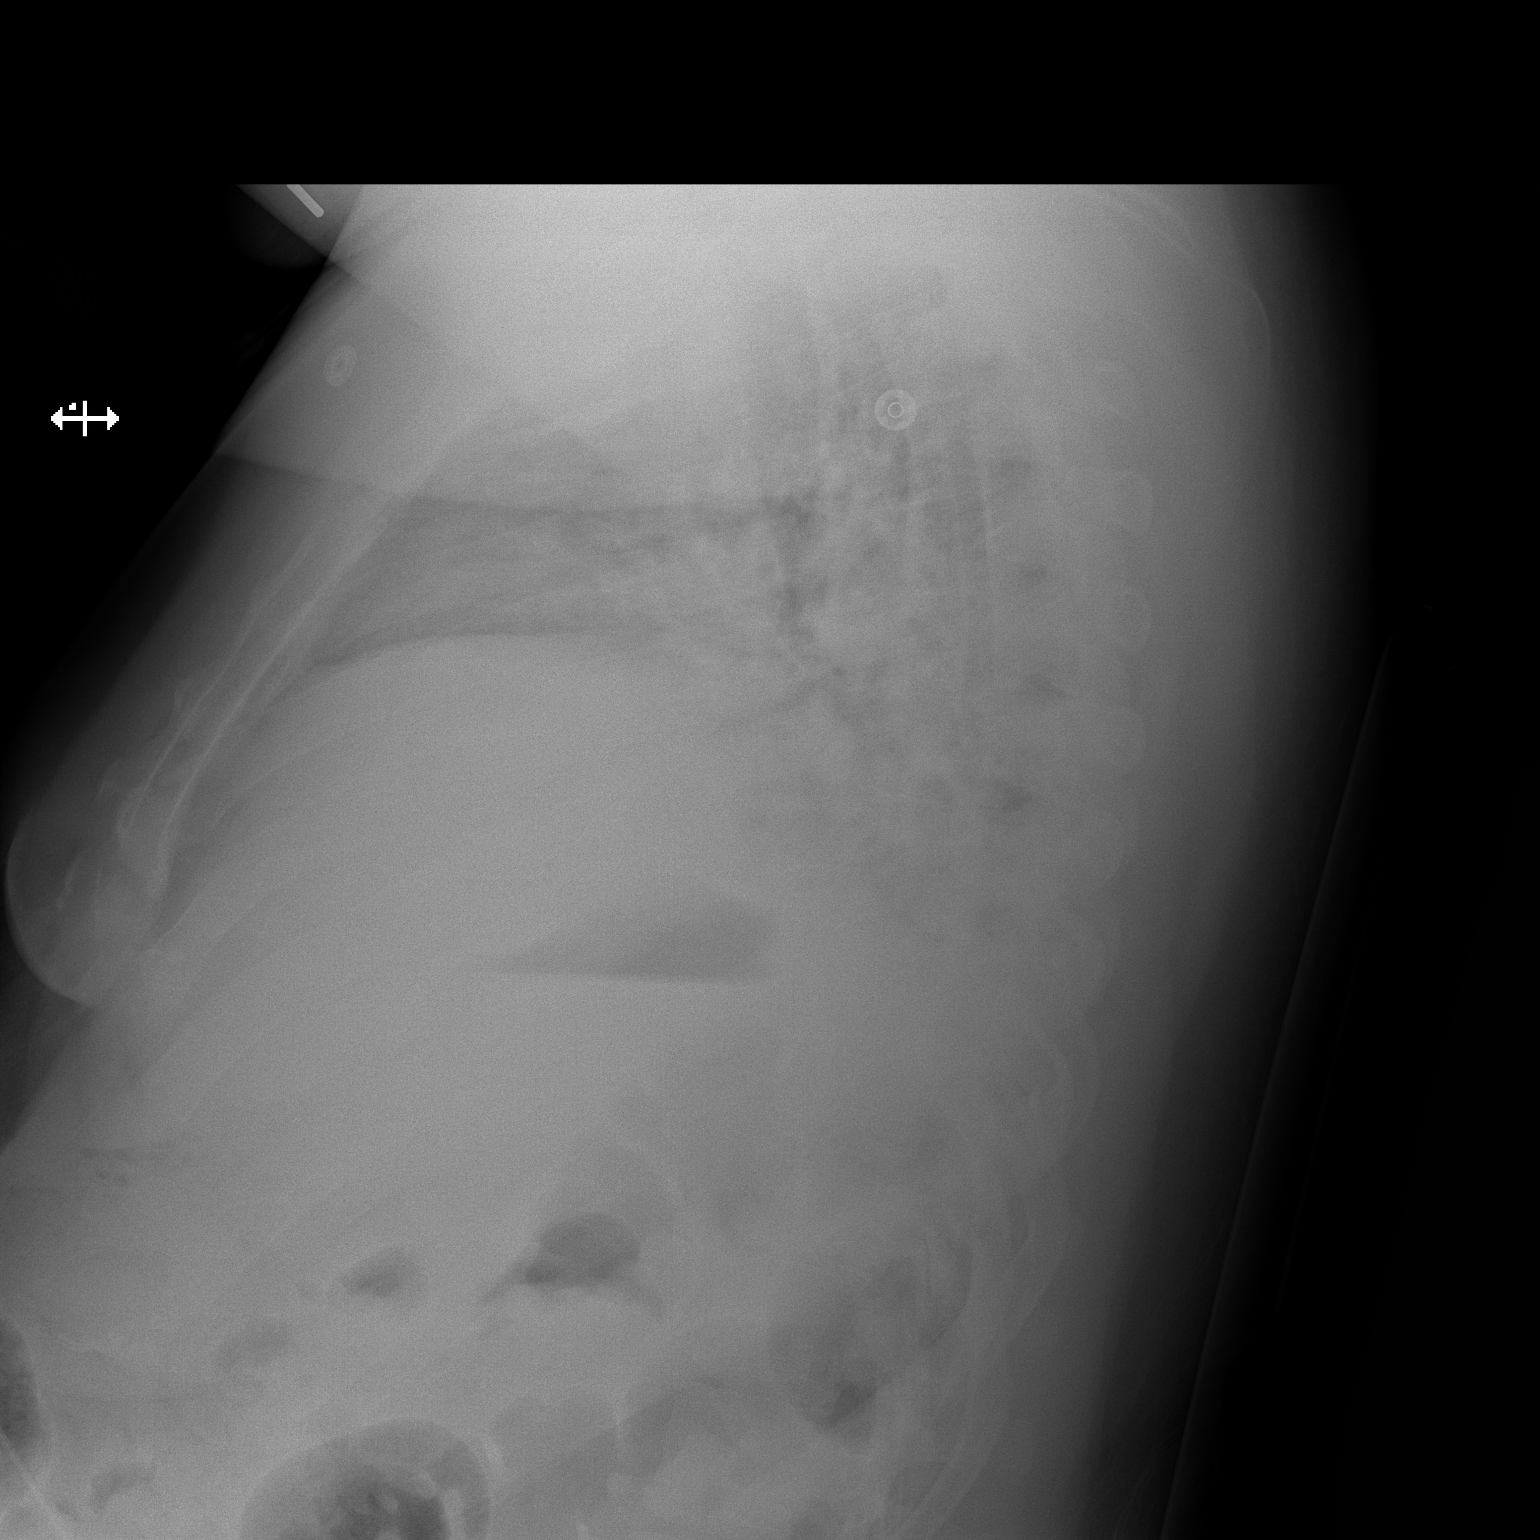

[2 of 2 positions shown; findings below may reference images not displayed]

FINDINGS: Cardiomegaly with diffuse bilateral pulmonary infiltrates/edema.
Interim improvement in aeration from prior study of 06/29/2018. No
pleural effusion or pneumothorax.
IMPRESSION: Cardiomegaly with diffuse bilateral pulmonary infiltrates/edema
again noted. Interim improvement in aeration from prior study
06/29/2018.

## 2020-01-25 ENCOUNTER — Ambulatory Visit: Payer: Self-pay | Admitting: Dentistry

## 2020-01-26 ENCOUNTER — Ambulatory Visit: Payer: Self-pay | Admitting: Dentistry

## 2020-01-26 ENCOUNTER — Encounter (HOSPITAL_COMMUNITY): Payer: Self-pay | Admitting: *Deleted

## 2020-01-26 NOTE — Progress Notes (Signed)
Pre-op instructions were provided to pt nurse, Ernestene Mention, RN of [facility]. Please complete assessment on DOS, nurse did not have pt chart available at her location. Order for additional labs on pt chart. Nurse denies that pt has a cardiac history. Nurse stated that Pt PCP is Dr. Liana Gerold. Nurse denies that pt had a stress test, echo and cardiac cath. Nurse denies that pt had an EKG and chest x ray. Nurse denies recent labs. Nurse made aware to have pt stop taking Aspirin vitamins, fish oil and herbal medications. Do not take any NSAIDs ie: Ibuprofen, Advil, Naproxen (Aleve), Motrin, BC and Goody Powder. Nurse stated that pt takes Metformin for pre-diabetes. Nurse stated that staff does not check pt CBG. Nurse verbalized understanding of all pre-op instructions.

## 2020-01-26 NOTE — Progress Notes (Signed)
Nurse spoke with Meliton Rattan, Surgical Scheduler, to make surgeon aware that H P is needed within 30 days of procedure; awaiting a return call.

## 2020-01-27 ENCOUNTER — Other Ambulatory Visit: Payer: Self-pay

## 2020-01-27 ENCOUNTER — Ambulatory Visit (HOSPITAL_COMMUNITY): Payer: Medicaid Other | Admitting: Certified Registered Nurse Anesthetist

## 2020-01-27 ENCOUNTER — Encounter (HOSPITAL_COMMUNITY)
Admission: RE | Disposition: A | Discharge status: Home / Self Care | Payer: Self-pay | Source: Home / Self Care | Attending: Dentistry

## 2020-01-27 ENCOUNTER — Encounter (HOSPITAL_COMMUNITY): Payer: Self-pay

## 2020-01-27 ENCOUNTER — Ambulatory Visit (HOSPITAL_COMMUNITY)
Admission: RE | Admit: 2020-01-27 | Discharge: 2020-01-27 | Disposition: A | Discharge status: Home / Self Care | Payer: Medicaid Other | Attending: Dentistry | Admitting: Dentistry

## 2020-01-27 DIAGNOSIS — Z20822 Contact with and (suspected) exposure to covid-19: Secondary | ICD-10-CM | POA: Insufficient documentation

## 2020-01-27 DIAGNOSIS — F79 Unspecified intellectual disabilities: Secondary | ICD-10-CM | POA: Insufficient documentation

## 2020-01-27 DIAGNOSIS — K029 Dental caries, unspecified: Secondary | ICD-10-CM | POA: Diagnosis present

## 2020-01-27 DIAGNOSIS — F840 Autistic disorder: Secondary | ICD-10-CM | POA: Insufficient documentation

## 2020-01-27 HISTORY — PX: DENTAL RESTORATION/EXTRACTION WITH X-RAY: SHX5796

## 2020-01-27 HISTORY — DX: Disorder of teeth and supporting structures, unspecified: K08.9

## 2020-01-27 HISTORY — DX: Type 2 diabetes mellitus without complications: E11.9

## 2020-01-27 LAB — CBC
HCT: 37.6 % — ABNORMAL LOW (ref 39.0–52.0)
Hemoglobin: 12.2 g/dL — ABNORMAL LOW (ref 13.0–17.0)
MCH: 28.4 pg (ref 26.0–34.0)
MCHC: 32.4 g/dL (ref 30.0–36.0)
MCV: 87.6 fL (ref 80.0–100.0)
Platelets: 229 10*3/uL (ref 150–400)
RBC: 4.29 MIL/uL (ref 4.22–5.81)
RDW: 13.8 % (ref 11.5–15.5)
WBC: 4.6 10*3/uL (ref 4.0–10.5)
nRBC: 0 % (ref 0.0–0.2)

## 2020-01-27 LAB — COMPREHENSIVE METABOLIC PANEL
ALT: 20 U/L (ref 0–44)
AST: 20 U/L (ref 15–41)
Albumin: 3.9 g/dL (ref 3.5–5.0)
Alkaline Phosphatase: 30 U/L — ABNORMAL LOW (ref 38–126)
Anion gap: 13 (ref 5–15)
BUN: 11 mg/dL (ref 6–20)
CO2: 21 mmol/L — ABNORMAL LOW (ref 22–32)
Calcium: 9.2 mg/dL (ref 8.9–10.3)
Chloride: 104 mmol/L (ref 98–111)
Creatinine, Ser: 1.01 mg/dL (ref 0.61–1.24)
GFR calc Af Amer: >60 mL/min (ref >60)
GFR calc non Af Amer: >60 mL/min (ref >60)
Glucose, Bld: 121 mg/dL — ABNORMAL HIGH (ref 70–99)
Potassium: 3.4 mmol/L — ABNORMAL LOW (ref 3.5–5.1)
Sodium: 138 mmol/L (ref 135–145)
Total Bilirubin: 0.5 mg/dL (ref 0.3–1.2)
Total Protein: 6.8 g/dL (ref 6.5–8.1)

## 2020-01-27 LAB — TSH: TSH: 2.627 u[IU]/mL (ref 0.350–4.500)

## 2020-01-27 LAB — HIV ANTIBODY (ROUTINE TESTING W REFLEX): HIV Screen 4th Generation wRfx: NONREACTIVE

## 2020-01-27 LAB — LIPID PANEL
Cholesterol: 165 mg/dL (ref 0–200)
HDL: 56 mg/dL (ref >40)
LDL Cholesterol: 92 mg/dL (ref 0–99)
Total CHOL/HDL Ratio: 2.9 RATIO
Triglycerides: 86 mg/dL (ref <150)
VLDL: 17 mg/dL (ref 0–40)

## 2020-01-27 LAB — HEPATITIS C ANTIBODY: HCV Ab: NONREACTIVE

## 2020-01-27 LAB — GLUCOSE, CAPILLARY: Glucose-Capillary: 121 mg/dL — ABNORMAL HIGH (ref 70–99)

## 2020-01-27 LAB — VALPROIC ACID LEVEL: Valproic Acid Lvl: 33 ug/mL — ABNORMAL LOW (ref 50.0–100.0)

## 2020-01-27 LAB — SARS CORONAVIRUS 2 BY RT PCR (HOSPITAL ORDER, PERFORMED IN ~~LOC~~ HOSPITAL LAB): SARS Coronavirus 2: NEGATIVE

## 2020-01-27 SURGERY — DENTAL RESTORATION/EXTRACTION WITH X-RAY
Anesthesia: General | Laterality: Bilateral

## 2020-01-27 MED ORDER — ACETAMINOPHEN 500 MG PO TABS: 1000.0000 mg | ORAL_TABLET | Freq: Once | ORAL | Status: DC | PRN

## 2020-01-27 MED ORDER — LIDOCAINE-EPINEPHRINE 2 %-1:100000 IJ SOLN
INTRAMUSCULAR | Status: AC
  Filled 2020-01-27: qty 1.7

## 2020-01-27 MED ORDER — MIDAZOLAM HCL 2 MG/ML PO SYRP: 20.0000 mg | ORAL_SOLUTION | Freq: Once | ORAL | Status: DC

## 2020-01-27 MED ORDER — KETAMINE HCL 100 MG/ML IJ SOLN
INTRAMUSCULAR | Status: AC
  Filled 2020-01-27: qty 1

## 2020-01-27 MED ORDER — FENTANYL CITRATE (PF) 100 MCG/2ML IJ SOLN: 25.0000 ug | INTRAMUSCULAR | Status: DC | PRN

## 2020-01-27 MED ORDER — 0.9 % SODIUM CHLORIDE (POUR BTL) OPTIME
TOPICAL | Status: DC | PRN
  Administered 2020-01-27: 500 mL

## 2020-01-27 MED ORDER — CHLORHEXIDINE GLUCONATE 0.12 % MT SOLN
OROMUCOSAL | Status: AC
  Filled 2020-01-27: qty 15

## 2020-01-27 MED ORDER — ROCURONIUM BROMIDE 10 MG/ML (PF) SYRINGE
PREFILLED_SYRINGE | INTRAVENOUS | Status: DC | PRN
  Administered 2020-01-27: 70 mg via INTRAVENOUS

## 2020-01-27 MED ORDER — LIDOCAINE 2% (20 MG/ML) 5 ML SYRINGE
INTRAMUSCULAR | Status: DC | PRN
  Administered 2020-01-27: 100 mg via INTRAVENOUS

## 2020-01-27 MED ORDER — FENTANYL CITRATE (PF) 250 MCG/5ML IJ SOLN
INTRAMUSCULAR | Status: AC
  Filled 2020-01-27: qty 5

## 2020-01-27 MED ORDER — KETAMINE HCL 100 MG/ML IJ SOLN
INTRAMUSCULAR | Status: DC | PRN
  Administered 2020-01-27: 700 mg via INTRAMUSCULAR

## 2020-01-27 MED ORDER — ACETAMINOPHEN 10 MG/ML IV SOLN: 1000.0000 mg | Freq: Once | INTRAVENOUS | Status: DC | PRN

## 2020-01-27 MED ORDER — PROPOFOL 10 MG/ML IV BOLUS
INTRAVENOUS | Status: DC | PRN
  Administered 2020-01-27: 150 mg via INTRAVENOUS

## 2020-01-27 MED ORDER — MIDAZOLAM HCL 2 MG/2ML IJ SOLN
INTRAMUSCULAR | Status: AC
  Filled 2020-01-27: qty 2

## 2020-01-27 MED ORDER — LIDOCAINE-EPINEPHRINE 2 %-1:100000 IJ SOLN
INTRAMUSCULAR | Status: AC
  Filled 2020-01-27: qty 6.8

## 2020-01-27 MED ORDER — MIDAZOLAM HCL 2 MG/ML PO SYRP: 0.5000 mg/kg | ORAL_SOLUTION | Freq: Once | ORAL | Status: DC

## 2020-01-27 MED ORDER — MIDAZOLAM HCL 2 MG/2ML IJ SOLN
INTRAMUSCULAR | Status: DC | PRN
  Administered 2020-01-27: 2 mg via INTRAVENOUS

## 2020-01-27 MED ORDER — PHENYLEPHRINE 40 MCG/ML (10ML) SYRINGE FOR IV PUSH (FOR BLOOD PRESSURE SUPPORT)
PREFILLED_SYRINGE | INTRAVENOUS | Status: DC | PRN
  Administered 2020-01-27 (×2): 40 ug via INTRAVENOUS
  Administered 2020-01-27: 120 ug via INTRAVENOUS
  Administered 2020-01-27: 80 ug via INTRAVENOUS

## 2020-01-27 MED ORDER — OXYMETAZOLINE HCL 0.05 % NA SOLN
NASAL | Status: DC | PRN
  Administered 2020-01-27: 2 via NASAL

## 2020-01-27 MED ORDER — OXYCODONE HCL 5 MG/5ML PO SOLN: 5.0000 mg | Freq: Once | ORAL | Status: DC | PRN

## 2020-01-27 MED ORDER — BUPIVACAINE-EPINEPHRINE (PF) 0.5% -1:200000 IJ SOLN
INTRAMUSCULAR | Status: AC
  Filled 2020-01-27: qty 9

## 2020-01-27 MED ORDER — ACETAMINOPHEN 160 MG/5ML PO SOLN: 1000.0000 mg | Freq: Once | ORAL | Status: DC | PRN

## 2020-01-27 MED ORDER — ONDANSETRON HCL 4 MG/2ML IJ SOLN
INTRAMUSCULAR | Status: DC | PRN
  Administered 2020-01-27: 4 mg via INTRAVENOUS

## 2020-01-27 MED ORDER — OXYMETAZOLINE HCL 0.05 % NA SOLN
NASAL | Status: AC
  Filled 2020-01-27: qty 30

## 2020-01-27 MED ORDER — LACTATED RINGERS IV SOLN: INTRAVENOUS | Status: DC | PRN

## 2020-01-27 MED ORDER — DEXAMETHASONE SODIUM PHOSPHATE 10 MG/ML IJ SOLN
INTRAMUSCULAR | Status: DC | PRN
  Administered 2020-01-27: 5 mg via INTRAVENOUS

## 2020-01-27 MED ORDER — OXYCODONE HCL 5 MG PO TABS: 5.0000 mg | ORAL_TABLET | Freq: Once | ORAL | Status: DC | PRN

## 2020-01-27 MED ORDER — PROPOFOL 10 MG/ML IV BOLUS
INTRAVENOUS | Status: AC
  Filled 2020-01-27: qty 20

## 2020-01-27 MED ORDER — MIDAZOLAM HCL 2 MG/ML PO SYRP
ORAL_SOLUTION | ORAL | Status: AC
  Filled 2020-01-27: qty 10

## 2020-01-27 MED ORDER — LIDOCAINE-EPINEPHRINE 2 %-1:100000 IJ SOLN
INTRAMUSCULAR | Status: DC | PRN
  Administered 2020-01-27: 3.4 mL

## 2020-01-27 SURGICAL SUPPLY — 35 items
BLADE SURG 15 STRL LF DISP TIS (BLADE) ×1 IMPLANT
BLADE SURG 15 STRL SS (BLADE)
BUR ROUND PRECISION 4.0 (BURR) ×1 IMPLANT
CANISTER SUCT 3000ML PPV (MISCELLANEOUS) ×2 IMPLANT
CONT SPEC 4OZ CLIKSEAL STRL BL (MISCELLANEOUS) ×2 IMPLANT
COVER BACK TABLE 60X90IN (DRAPES) ×2 IMPLANT
COVER MAYO STAND STRL (DRAPES) ×2 IMPLANT
COVER PROBE W GEL 5X96 (DRAPES) IMPLANT
COVER SURGICAL LIGHT HANDLE (MISCELLANEOUS) ×2 IMPLANT
COVER WAND RF STERILE (DRAPES) IMPLANT
DRAPE HALF SHEET 40X57 (DRAPES) ×2 IMPLANT
GAUZE 4X4 16PLY RFD (DISPOSABLE) ×2 IMPLANT
GLOVE EXAM NITRILE PF SM BLUE (GLOVE) ×2 IMPLANT
GLOVE SURG SS PI 8.0 STRL IVOR (GLOVE) ×1 IMPLANT
GOWN STRL REUS W/ TWL XL LVL3 (GOWN DISPOSABLE) ×2 IMPLANT
GOWN STRL REUS W/TWL XL LVL3 (GOWN DISPOSABLE) ×4
KIT BASIN OR (CUSTOM PROCEDURE TRAY) ×2 IMPLANT
KIT TURNOVER KIT B (KITS) ×2 IMPLANT
NDL 25GX 5/8IN NON SAFETY (NEEDLE) ×1 IMPLANT
NDL PRECISIONGLIDE 27X1.5 (NEEDLE) IMPLANT
NEEDLE 25GX 5/8IN NON SAFETY (NEEDLE) ×2 IMPLANT
NEEDLE PRECISIONGLIDE 27X1.5 (NEEDLE) IMPLANT
NS IRRIG 1000ML POUR BTL (IV SOLUTION) ×1 IMPLANT
PAD ARMBOARD 7.5X6 YLW CONV (MISCELLANEOUS) ×4 IMPLANT
PAD SHARPS MAGNETIC DISPOSAL (MISCELLANEOUS) ×2 IMPLANT
SPONGE SURGIFOAM ABS GEL SZ50 (HEMOSTASIS) IMPLANT
SUT CHROMIC 3 0 PS 2 (SUTURE) IMPLANT
SYR BULB IRRIG 60ML STRL (SYRINGE) ×2 IMPLANT
SYR CONTROL 10ML LL (SYRINGE) ×2 IMPLANT
TOOTHBRUSH ADULT (PERSONAL CARE ITEMS) ×1 IMPLANT
TOWEL GREEN STERILE (TOWEL DISPOSABLE) ×2 IMPLANT
TUBE CONNECTING 12X1/4 (SUCTIONS) ×2 IMPLANT
WATER STERILE IRR 1000ML POUR (IV SOLUTION) ×1 IMPLANT
WATER TABLETS ICX (MISCELLANEOUS) ×2 IMPLANT
YANKAUER SUCT BULB TIP NO VENT (SUCTIONS) ×2 IMPLANT

## 2020-01-27 NOTE — H&P (Signed)
H&P reviewed, no changes in assessment  

## 2020-01-27 NOTE — Op Note (Signed)
[facility]  01/27/2020 Glenn [name] [id number]  Preop DX: Dental caries/behavior management issues due to intellectual disabilities/developmental disabilities. Dental Care provided in OR for medically necessary treatment.  Surgeon: Cristino Martes, DMD  Assistant: Winnifred Friar, DA II and hospital staff.  Anesthesia: General  Procedure: The patient was brought into the operating room and placed on the table in a supine position.  General anesthesia was administered via nasal intubation.  The patient was prepped and draped in the usual manner for an intra-oral general dentistry procedure. The oropharynx was suctioned and a moistened oropharyngeal throat pack was placed.    A full intra-oral exam including all hard and soft tissues was performed.  Type of Exam: Comprehensive   Soft Tissue Exam: Floor of the mouth: Normal Buccal mucosa: Normal Soft palate: Normal Hard palate: Normal Tongue: Normal Gingival: Normal Frenum: Normal  Hard tissue exam:  Present: # 1-32 Missing: None Un-erupted: N/A Radiographic findings decay: MB#14, O#17, O#18, MODBL#32 Radiographic findings abscess: #32  Full mouth series of digital radiographs taken and reviewed. A comprehensive treatment plan was developed.  Operative care was accomplished in a standard fashion using high/low speed drills with copious irrigation.  Routine extractions were accomplished with simple elevation and use of forceps. Surgical Extractions were done in a standard fashion with full facial thickness flaps to gain access, otectomy / osteoplasty with copious irrigation to expose the teeth. Teeth with multi-roots were sectioned as needed to minimize surgical trauma.  Alveoplasty:No  All surgical sites were irrigated with copious amounts of saline. Gel foam was placed in the sockets and hemostasis established with firm pressure. Surgical sites were closed with 3-0 Chromic sutures.  Local Anes:Lidocaine 2% with  1:100,000 epinephrine 3.4 mls The estimated blood loss was 5 mls.   Upon completion of all procedures the oropharynx was irrigated of all debris. Mouth was suctioned dry and a posterior throat pack was carefully removed with constant suction. Hemostasis was established and a gauze pack was placed as an intraoral pressure dressing. After spontaneous respirations the patient was extubated and transported to the Post-Anesthesia care unit in awake but in a sedated condition. The patient tolerated the procedure well and without complications.  An explanation of procedures and extractions were given to his caregiver.  Operative Procedures:  Full mouth debridement: Yes Extractions completed: # 32 Amalgams: N/A Composites/Glass Ionomers: Coronal, chewing, dentin #14, Coronal, chewing, enamel #17, Coronal, chewing, enamel #18 Fluoride varnish: Yes Gingivectomy: No Alveloplasty: No Crown: N/A Bridge: N/A  Postoperative Meds:  None  Postoperative Instructions: Extraction sheet signed and given to patient representative.    Cristino Martes, DMD

## 2020-01-27 NOTE — Brief Op Note (Signed)
01/27/2020  1:55 PM  PATIENT:  Glenn [name]  34 y.o. male  PRE-OPERATIVE DIAGNOSIS:  DENTAL CARIES  POST-OPERATIVE DIAGNOSIS:  DENTAL CARIES  PROCEDURE:  Procedure(s): DENTAL RESTORATION AND DENTAL EXTRACTION #32 WITH X-RAY (Bilateral)  SURGEON:  Surgeon(s) and Role:    * Cristino Martes, DMD - Primary  PHYSICIAN ASSISTANT:   ASSISTANTS: Ruben Gottron, DA II   ANESTHESIA:   general  EBL:  5 ml  BLOOD ADMINISTERED:none  DRAINS: none   LOCAL MEDICATIONS USED:  LIDOCAINE   SPECIMEN:  No Specimen  DISPOSITION OF SPECIMEN:  N/A  COUNTS:  YES  TOURNIQUET:  * No tourniquets in log *  DICTATION: .Note written in EPIC  PLAN OF CARE: Discharge to home after PACU  PATIENT DISPOSITION:  ICU - extubated and stable.   Delay start of Pharmacological VTE agent (>24hrs) due to surgical blood loss or risk of bleeding: no

## 2020-01-27 NOTE — Transfer of Care (Signed)
Immediate Anesthesia Transfer of Care Note  Patient: Glenn [name]  Procedure(s) Performed: DENTAL RESTORATION AND DENTAL EXTRACTION #32 WITH X-RAY (Bilateral )  Patient Location: PACU  Anesthesia Type:General  Level of Consciousness: lethargic and responds to stimulation  Airway & Oxygen Therapy: Patient Spontanous Breathing  Post-op Assessment: Report given to RN  Post vital signs: Reviewed and stable  Last Vitals:  Vitals Value Taken Time  BP    Temp    Pulse 94 01/27/20 1405  Resp 31 01/27/20 1405  SpO2 100 % 01/27/20 1405  Vitals shown include unvalidated device data.  Last Pain: There were no vitals filed for this visit.       Complications: No complications documented.

## 2020-01-27 NOTE — Anesthesia Procedure Notes (Signed)
Procedure Name: Intubation Date/Time: 01/27/2020 11:21 AM Performed by: De Nurse, CRNA Pre-anesthesia Checklist: Patient identified, Emergency Drugs available, Suction available and Patient being monitored Patient Re-evaluated:Patient Re-evaluated prior to induction Oxygen Delivery Method: Circle System Utilized Preoxygenation: Pre-oxygenation with 100% oxygen Induction Type: Combination inhalational/ intravenous induction Ventilation: Mask ventilation without difficulty Laryngoscope Size: Glidescope and 4 Grade View: Grade I Nasal Tubes: Nasal Rae, Nasal prep performed and Right Tube size: 7.5 mm Number of attempts: 1 Placement Confirmation: ETT inserted through vocal cords under direct vision,  positive ETCO2 and breath sounds checked- equal and bilateral Secured at: 28 cm Tube secured with: Tape Dental Injury: Teeth and Oropharynx as per pre-operative assessment  Comments: Placed by D. [place], [place]

## 2020-01-28 ENCOUNTER — Encounter (HOSPITAL_COMMUNITY): Payer: Self-pay | Admitting: Dentistry

## 2020-01-28 LAB — HEMOGLOBIN A1C
Hgb A1c MFr Bld: 5.3 % (ref 4.8–5.6)
Mean Plasma Glucose: 105 mg/dL

## 2020-01-30 ENCOUNTER — Encounter (HOSPITAL_COMMUNITY): Payer: Self-pay | Admitting: Dentistry

## 2020-01-30 MED ORDER — KETAMINE HCL 100 MG/ML IJ SOLN
INTRAMUSCULAR | Status: DC | PRN
  Administered 2020-01-27: 500 mg via INTRAVENOUS

## 2020-01-30 NOTE — Anesthesia Preprocedure Evaluation (Signed)
Anesthesia Evaluation  Patient identified by MRN, date of birth, ID band Patient awake    Reviewed: Allergy & Precautions, NPO status , Patient's Chart, lab work & pertinent test results  History of Anesthesia Complications Negative for: history of anesthetic complications  Airway      Mouth opening: Limited Mouth Opening  Dental  (+) Poor Dentition, Dental Advisory Given   Pulmonary neg pulmonary ROS,    breath sounds clear to auscultation       Cardiovascular negative cardio ROS   Rhythm:Regular     Neuro/Psych Autism, developmental delay   GI/Hepatic negative GI ROS, Neg liver ROS,   Endo/Other  diabetesMorbid obesity  Renal/GU Renal InsufficiencyRenal disease     Musculoskeletal negative musculoskeletal ROS (+)   Abdominal   Peds  Hematology   Anesthesia Other Findings   Reproductive/Obstetrics                             Anesthesia Physical Anesthesia Plan  ASA: II  Anesthesia Plan: General   Post-op Pain Management:    Induction: Inhalational  PONV Risk Score and Plan: 2 and Ondansetron and Dexamethasone  Airway Management Planned: Nasal ETT  Additional Equipment: None  Intra-op Plan:   Post-operative Plan: Extubation in OR  Informed Consent: I have reviewed the patients History and Physical, chart, labs and discussed the procedure including the risks, benefits and alternatives for the proposed anesthesia with the patient or authorized representative who has indicated his/her understanding and acceptance.     Dental advisory given and Consent reviewed with POA  Plan Discussed with:   Anesthesia Plan Comments:         Anesthesia Quick Evaluation

## 2020-01-30 NOTE — Anesthesia Postprocedure Evaluation (Signed)
Anesthesia Post Note  Patient: Glenn [name]  Procedure(s) Performed: DENTAL RESTORATION AND DENTAL EXTRACTION #32 WITH X-RAY (Bilateral )     Patient location during evaluation: PACU Anesthesia Type: General Level of consciousness: patient cooperative and awake Pain management: pain level controlled Vital Signs Assessment: post-procedure vital signs reviewed and stable Respiratory status: spontaneous breathing, nonlabored ventilation, respiratory function stable and patient connected to nasal cannula oxygen Cardiovascular status: blood pressure returned to baseline and stable Postop Assessment: no apparent nausea or vomiting Anesthetic complications: no   No complications documented.  Last Vitals:  Vitals:   01/27/20 1420 01/27/20 1429  BP: 108/78 117/61  Pulse: 91 89  Resp: 18 16  Temp:  36.5 C  SpO2: 100% 100%    Last Pain: There were no vitals filed for this visit.               Kayloni Rocco

## 2020-02-02 NOTE — Progress Notes (Signed)
Pt attempted - unable to reach °

## 2021-02-28 DIAGNOSIS — R4689 Other symptoms and signs involving appearance and behavior: Secondary | ICD-10-CM | POA: Insufficient documentation

## 2023-02-20 DIAGNOSIS — R451 Restlessness and agitation: Secondary | ICD-10-CM | POA: Insufficient documentation

## 2023-07-24 ENCOUNTER — Ambulatory Visit: Payer: Self-pay | Admitting: Family Medicine

## 2023-08-12 ENCOUNTER — Ambulatory Visit: Payer: MEDICAID | Admitting: Family Medicine

## 2023-08-29 ENCOUNTER — Ambulatory Visit (INDEPENDENT_AMBULATORY_CARE_PROVIDER_SITE_OTHER): Payer: MEDICAID | Admitting: Internal Medicine

## 2023-08-29 ENCOUNTER — Encounter: Payer: Self-pay | Admitting: Internal Medicine

## 2023-08-29 VITALS — Temp 97.8°F | Wt 238.6 lb

## 2023-08-29 DIAGNOSIS — R7303 Prediabetes: Secondary | ICD-10-CM

## 2023-08-29 DIAGNOSIS — F840 Autistic disorder: Secondary | ICD-10-CM

## 2023-08-29 MED ORDER — METFORMIN HCL 500 MG PO TABS: 500.0000 mg | ORAL_TABLET | Freq: Two times a day (BID) | ORAL | 1 refills | Status: DC

## 2023-08-29 NOTE — Progress Notes (Signed)
[facility] [address] [place] [address] Dept: [phone] Dept Fax: [phone]  New Patient Office Visit  Subjective:   Glenn [name] Apr 02, 1986 08/29/2023  Chief Complaint  Patient presents with   Establish Care   Behavior Problem   Medication Refill   Insomnia    HPI: Glenn [name] presents today to establish care at [facility] [employer]. Introduced to Publishing rights manager role and practice setting.  All questions answered.  Concerns: See below   Discussed the use of AI scribe software for clinical note transcription with the patient, who gave verbal consent to proceed.  History of Present Illness   The patient, a male with a history of severe autism, is reported to have an increase in aggressive behavior. His mother reports that he has been having episodes of intense kicking and hitting. Patient is non-verbal. The patient is currently on multiple psychiatric medications including lorazepam and Haldol, but the mother does not believe these are effectively managing his behavior. Patient is managed by [facility] for psychiatric medications. Mother and sister report patient has been out of his lorazepam for 1 week. They reports that the patient's behavior fluctuates, with some days being better than others. The patient also has a history of prediabetes and is currently on metformin. The mother expresses concern about the patient's blood sugar levels but has difficulty getting blood work done due to the patient's fear of needles. They are concerned the patient may need to be sedated in order to get lab work.       The following portions of the patient's history were reviewed and updated as appropriate: past medical history, past surgical history, family history, social history, allergies, medications, and problem list.   Patient Active Problem List   Diagnosis Date Noted   Sepsis (HCC) 06/27/2018    Autism 06/27/2018   Renal insufficiency 06/27/2018   Past Medical History:  Diagnosis Date   Autism    non-verbal   Diabetes mellitus without complication (HCC)    Mental retardation    Poor dentition    Past Surgical History:  Procedure Laterality Date   DENTAL RESTORATION/EXTRACTION WITH X-RAY Bilateral 01/27/2020   Procedure: DENTAL RESTORATION AND DENTAL EXTRACTION #32 WITH X-RAY;  Surgeon: Cristino Martes, DMD;  Location: [facility] OR;  Service: Dentistry;  Laterality: Bilateral;   Family History  Problem Relation Age of Onset   Asthma Mother    Hypertension Mother    Healthy Father     Current Outpatient Medications:    benztropine (COGENTIN) 0.5 MG tablet, Take 0.5 mg by mouth at bedtime., Disp: , Rfl:    divalproex (DEPAKOTE) 250 MG DR tablet, Take 500 mg by mouth 2 (two) times daily. , Disp: , Rfl:    haloperidol (HALDOL) 5 MG tablet, Take 5 mg by mouth 2 (two) times daily., Disp: , Rfl:    hydrOXYzine (VISTARIL) 50 MG capsule, Take 100 mg by mouth at bedtime., Disp: , Rfl:    LORazepam (ATIVAN) 0.5 MG tablet, Take 2 mg by mouth 3 (three) times daily as needed., Disp: , Rfl:    metFORMIN (GLUCOPHAGE) 500 MG tablet, Take 1 tablet (500 mg total) by mouth 2 (two) times daily with a meal., Disp: 180 tablet, Rfl: 1   mirtazapine (REMERON) 45 MG tablet, Take 45 mg by mouth at bedtime., Disp: , Rfl:    QUEtiapine (SEROQUEL) 400 MG tablet, Take 400 mg by mouth 2 (two) times daily. , Disp: ,  Rfl:  Not on File  ROS: A complete ROS was performed with pertinent positives/negatives noted in the HPI. The remainder of the ROS are negative.   Objective:   Today's Vitals   08/29/23 1456  Temp: 97.8 F (36.6 C)  TempSrc: Temporal  Weight: 238 lb 9.6 oz (108.2 kg)   Unable to obtain full vital signs due to patient's aggressive behavior.   GENERAL: Well-appearing, in NAD. Well nourished.  SKIN: Pink, warm and dry. No visible rash.  RESPIRATORY: Respirations even and non-labored.   NEUROLOGIC: Steady, even gait.  PSYCH/MENTAL STATUS: Alert, nonverbal. Aggressive, throwing objects in room. Able to be re-directed with verbal commands.   Health Maintenance Due  Topic Date Due   DTaP/Tdap/Td (1 - Tdap) Never done   INFLUENZA VACCINE  Never done   COVID-19 Vaccine (1 - 2024-25 season) Never done    No results found for any visits on 08/29/23.  Assessment & Plan:  Assessment and Plan    Autism with Behavioral Issues - mother to contact psychiatrist for medication adjustments.  Prediabetes Unclear current status, not currently monitoring blood glucose levels. -Continue metformin 500mg  twice daily, refill for 90-day supply sent to pharmacy.  Medication Management Mother reports difficulty keeping track of multiple medications and doses. -Recommend use of a pill organizer or similar system to help manage medications.   Follow-up Plan for follow-up visit in approximately three months. In the meantime, mother to contact psychiatrist for medication adjustments.       No orders of the defined types were placed in this encounter.  Meds ordered this encounter  Medications   metFORMIN (GLUCOPHAGE) 500 MG tablet    Sig: Take 1 tablet (500 mg total) by mouth 2 (two) times daily with a meal.    Dispense:  180 tablet    Refill:  1    Supervising Provider:   Garnette Gunner [[id number]]    Return in about 3 months (around 11/27/2023) for Chronic Condition follow up.   Salvatore Decent, FNP

## 2023-09-02 DIAGNOSIS — R7303 Prediabetes: Secondary | ICD-10-CM | POA: Insufficient documentation

## 2023-09-18 ENCOUNTER — Telehealth: Payer: Self-pay

## 2023-09-18 NOTE — Telephone Encounter (Signed)
 Copied from CRM [id number]. Topic: Clinical - Lab/Test Results >> Sep 18, 2023  3:43 PM Howard Macho wrote: Reason for CRM: patient sister (deaundra Mysliwiec) called stating she is checking on the status of a blood order CB [phone]

## 2023-09-19 NOTE — Telephone Encounter (Signed)
 Please notify patient's sister that I have spoken with Dr. Berneta, our lead physician, and we both agree at this time it is best to hold off on blood work until it is absolutely necessary, as sedating the patient just to get lab work would pose more of a risk to the patient than benefit.

## 2023-09-20 NOTE — Telephone Encounter (Signed)
 I called and spoke with patient's sister and notified her of Gavin Kast.NP message.

## 2023-11-27 ENCOUNTER — Ambulatory Visit: Payer: MEDICAID | Admitting: Internal Medicine

## 2023-12-16 ENCOUNTER — Ambulatory Visit: Payer: MEDICAID | Admitting: Internal Medicine

## 2024-01-17 ENCOUNTER — Ambulatory Visit: Payer: MEDICAID | Admitting: Internal Medicine

## 2024-01-20 ENCOUNTER — Telehealth: Payer: Self-pay | Admitting: Internal Medicine

## 2024-01-20 NOTE — Telephone Encounter (Signed)
 01/17/2024 no show 12/16/2023 same day cancel/sister called  Final warning sent via mail & sent text

## 2024-02-25 ENCOUNTER — Encounter: Payer: Self-pay | Admitting: Internal Medicine

## 2024-02-25 ENCOUNTER — Other Ambulatory Visit (HOSPITAL_COMMUNITY): Payer: Self-pay

## 2024-02-25 ENCOUNTER — Telehealth: Payer: Self-pay

## 2024-02-25 ENCOUNTER — Ambulatory Visit (INDEPENDENT_AMBULATORY_CARE_PROVIDER_SITE_OTHER): Payer: MEDICAID | Admitting: Internal Medicine

## 2024-02-25 VITALS — BP 125/91 | HR 89 | Temp 98.5°F | Ht 72.0 in | Wt 247.4 lb

## 2024-02-25 DIAGNOSIS — F840 Autistic disorder: Secondary | ICD-10-CM

## 2024-02-25 DIAGNOSIS — R7303 Prediabetes: Secondary | ICD-10-CM | POA: Diagnosis not present

## 2024-02-25 MED ORDER — FREESTYLE LIBRE 3 PLUS SENSOR MISC: 12 refills | Status: AC

## 2024-02-25 NOTE — Patient Instructions (Addendum)
 [email or web address]  Lingo Device - go to website above and order     Discuss with [facility] about increasing his Remeron  (Mirtazapine ) dose for bedtime

## 2024-02-25 NOTE — Progress Notes (Signed)
 [facility]IMARY [facility]ARE [facility] [address] [place] [address] Dept: [phone] Dept Fax: [phone]    Subjective:   Glenn [name] [birth year] 02/25/2024  Chief Complaint  Patient presents with   Follow-up   Diabetes Management Plan    Discuss freestyle libre    HPI: Glenn [name] presents today for re-assessment and management of chronic medical conditions.  Discussed the use of AI scribe software for clinical note transcription with the patient, who gave verbal consent to proceed.  History of Present Illness   Glenn [name] is a 38 year old male with prediabetes and autism who presents for a follow-up checkup. He is accompanied by his mother and sister.  Blood sugars are not monitored regularly due to patient agitation. Family would like to discuss trying CGM device. Takes Metformin  500mg  BID   Regarding his autism, there are ongoing concerns about behavior and sleep patterns. He has difficulty sleeping,  often wakes up around 4 or 5 AM, resulting in insufficient sleep. He is currently taking Haldol  5mg  BID, Cogentin  0.5mg  PO bedtime, Hydroxyzine 100mg  PO bedtime, Ativan  2mg  TID PRN agitation , and Remeron  45mg  at bedtime. His mother mentions that he is working with [facility] to adjust his medications, including Ativan  and Cogentin , and he has an upcoming appointment with his psychiatrist to discuss these issues further.       The following portions of the patient's history were reviewed and updated as appropriate: past medical history, past surgical history, family history, social history, allergies, medications, and problem list.   Patient Active Problem List   Diagnosis Date Noted   Prediabetes 09/02/2023   Agitation 02/20/2023   Aggressive behavior 02/28/2021   Autism spectrum disorder requiring very substantial support (level 3) 06/27/2018   Past Medical History:  Diagnosis Date   Autism    non-verbal   Diabetes  mellitus without complication (HCC)    Mental retardation    Poor dentition    Past Surgical History:  Procedure Laterality Date   DENTAL RESTORATION/EXTRACTION WITH X-RAY Bilateral 01/27/2020   Procedure: DENTAL RESTORATION AND DENTAL EXTRACTION #32 WITH X-RAY;  Surgeon: Tanda Madeleine PARAS, DMD;  Location: [facility] OR;  Service: Dentistry;  Laterality: Bilateral;   Family History  Problem Relation Age of Onset   Asthma Mother    Hypertension Mother    Healthy Father     Current Outpatient Medications:    benztropine  (COGENTIN ) 0.5 MG tablet, Take 0.5 mg by mouth at bedtime., Disp: , Rfl:    Continuous Glucose Sensor (FREESTYLE LIBRE 3 PLUS SENSOR) MISC, Change sensor every 15 days., Disp: 2 each, Rfl: 12   divalproex  (DEPAKOTE ) 250 MG DR tablet, Take 500 mg by mouth 2 (two) times daily. , Disp: , Rfl:    haloperidol  (HALDOL ) 5 MG tablet, Take 5 mg by mouth 2 (two) times daily., Disp: , Rfl:    hydrOXYzine (VISTARIL) 50 MG capsule, Take 100 mg by mouth at bedtime., Disp: , Rfl:    LORazepam  (ATIVAN ) 0.5 MG tablet, Take 2 mg by mouth 3 (three) times daily as needed., Disp: , Rfl:    metFORMIN  (GLUCOPHAGE ) 500 MG tablet, Take 1 tablet (500 mg total) by mouth 2 (two) times daily with a meal., Disp: 180 tablet, Rfl: 1   mirtazapine  (REMERON ) 45 MG tablet, Take 45 mg by mouth at bedtime., Disp: , Rfl:    QUEtiapine  (SEROQUEL ) 400 MG tablet, Take 400 mg by mouth 2 (two) times daily. , Disp: , Rfl:  Not on File   ROS: A complete ROS was performed with pertinent positives/negatives noted in the HPI. The remainder of the ROS are negative.    Objective:   Today's Vitals   02/25/24 1317  BP: (!) 125/91  Pulse: 89  Temp: 98.5 F (36.9 C)  TempSrc: Temporal  SpO2: 97%  Weight: 247 lb 6.4 oz (112.2 kg)  Height: 6' (1.829 m)    GENERAL: Well-appearing, in NAD. Well nourished.  SKIN: Pink, warm and dry. NECK: Trachea midline.  RESPIRATORY: Chest wall symmetrical. Respirations even and  non-labored. Breath sounds clear to auscultation bilaterally.  CARDIAC: S1, S2 present, regular rate and rhythm. Peripheral pulses 2+ bilaterally.  EXTREMITIES: Without clubbing, cyanosis, or edema.  NEUROLOGIC: Steady, even gait.  PSYCH/MENTAL STATUS: Alert, nonverbal Cooperative, appropriate mood and affect.   Health Maintenance Due  Topic Date Due   DTaP/Tdap/Td (1 - Tdap) Never done   Hepatitis B Vaccines (1 of 3 - 19+ 3-dose series) Never done   HPV VACCINES (1 - 3-dose SCDM series) Never done    No results found for any visits on 02/25/24.  The ASCVD Risk score (Arnett DK, et al., 2019) failed to calculate for the following reasons:   The 2019 ASCVD risk score is only valid for ages 30 to 19     Assessment & Plan:  Assessment and Plan    Autism Spectrum Disorder with Behavioral Issues Autism with behavioral issues managed with Haldol , Hydroxyzine, Cogentin , Ativan , and Remeron . Need to balance sleep management and avoid daytime drowsiness. Psychiatrist involved in medication adjustments. - Continue current regimen - Consult psychiatrist at [facility] about increasing Remeron  dose for improving sleep - Monitor behavioral changes and sleep patterns.  Prediabetes Monitored for prediabetes with concerns about glucose levels and progression to type 2 diabetes. Discussed continuous glucose monitoring systems. Insurance unlikely to cover devices for prediabetes. - Order [employer] and [employer] coverage. - Provide information on Lingo device and website for purchase. - Advise to return if glucose levels are consistently 150 mg/dL or higher without recent food intake.     No orders of the defined types were placed in this encounter.  No images are attached to the encounter or orders placed in the encounter. Meds ordered this encounter  Medications   Continuous Glucose Sensor (FREESTYLE LIBRE 3 PLUS SENSOR) MISC    Sig: Change sensor every 15 days.    Dispense:  2 each     Refill:  12    Supervising Provider:   SEBASTIAN BEVERLEY NOVAK [[id number]]    Return in about 6 months (around 08/27/2024) for prediabetes.   Rosina Senters, FNP

## 2024-02-25 NOTE — Telephone Encounter (Signed)
 Pharmacy Patient Advocate Encounter   Received notification from CoverMyMeds that prior authorization for FreeStyle Libre 3 Plus Sensor is required/requested.   Insurance verification completed.   The patient is insured through [facility] .   Per test claim: PA required; PA submitted to above mentioned insurance via CoverMyMeds Key/confirmation #/EOC (Key: [id number])   Status is pending

## 2024-02-26 NOTE — Telephone Encounter (Signed)
 Pharmacy Patient Advocate Encounter  Received notification from [facility] that Prior Authorization for FreeStyle Libre 3 Plus Sensor  has been DENIED.  Full denial letter will be uploaded to the media tab. See denial reason below.   PA #/Case ID/Reference #: [id number]

## 2024-03-22 ENCOUNTER — Other Ambulatory Visit: Payer: Self-pay | Admitting: Internal Medicine

## 2024-03-22 DIAGNOSIS — R7303 Prediabetes: Secondary | ICD-10-CM

## 2024-04-15 ENCOUNTER — Telehealth: Payer: Self-pay

## 2024-04-15 NOTE — Telephone Encounter (Signed)
 Returned call to [facility] to clarify what is needed she stated she would fax form to be filled out.  Copied from CRM [id number]. Topic: General - Other >> Apr 14, 2024  1:53 PM Rosina BIRCH wrote: Reason for CRM: [email or web address] from [facility] called stating she need a signed order for all current meds for the patient so the nurse can give it to him. They started the process on 8/8 and have no received a response CB [phone] Fax-[phone]

## 2024-04-28 ENCOUNTER — Encounter (HOSPITAL_COMMUNITY): Payer: Self-pay

## 2024-04-28 ENCOUNTER — Emergency Department (HOSPITAL_COMMUNITY)
Admission: EM | Admit: 2024-04-28 | Discharge: 2024-04-29 | Disposition: A | Discharge status: Home / Self Care | Payer: MEDICAID | Attending: Emergency Medicine | Admitting: Emergency Medicine

## 2024-04-28 DIAGNOSIS — F840 Autistic disorder: Secondary | ICD-10-CM | POA: Diagnosis present

## 2024-04-28 DIAGNOSIS — E119 Type 2 diabetes mellitus without complications: Secondary | ICD-10-CM | POA: Diagnosis not present

## 2024-04-28 DIAGNOSIS — R4689 Other symptoms and signs involving appearance and behavior: Secondary | ICD-10-CM | POA: Diagnosis present

## 2024-04-28 DIAGNOSIS — Z7984 Long term (current) use of oral hypoglycemic drugs: Secondary | ICD-10-CM | POA: Insufficient documentation

## 2024-04-28 DIAGNOSIS — R451 Restlessness and agitation: Secondary | ICD-10-CM | POA: Diagnosis not present

## 2024-04-28 LAB — CBC WITH DIFFERENTIAL/PLATELET
Abs Immature Granulocytes: 0.01 K/uL (ref 0.00–0.07)
Basophils Absolute: 0 K/uL (ref 0.0–0.1)
Basophils Relative: 0 %
Eosinophils Absolute: 0 K/uL (ref 0.0–0.5)
Eosinophils Relative: 1 %
HCT: 38.8 % — ABNORMAL LOW (ref 39.0–52.0)
Hemoglobin: 12.6 g/dL — ABNORMAL LOW (ref 13.0–17.0)
Immature Granulocytes: 0 %
Lymphocytes Relative: 39 %
Lymphs Abs: 1.8 K/uL (ref 0.7–4.0)
MCH: 27.9 pg (ref 26.0–34.0)
MCHC: 32.5 g/dL (ref 30.0–36.0)
MCV: 85.8 fL (ref 80.0–100.0)
Monocytes Absolute: 0.4 K/uL (ref 0.1–1.0)
Monocytes Relative: 9 %
Neutro Abs: 2.3 K/uL (ref 1.7–7.7)
Neutrophils Relative %: 51 %
Platelets: 138 K/uL — ABNORMAL LOW (ref 150–400)
RBC: 4.52 MIL/uL (ref 4.22–5.81)
RDW: 14.6 % (ref 11.5–15.5)
WBC: 4.5 K/uL (ref 4.0–10.5)
nRBC: 0 % (ref 0.0–0.2)

## 2024-04-28 LAB — BASIC METABOLIC PANEL WITH GFR
Anion gap: 10 (ref 5–15)
BUN: 15 mg/dL (ref 6–20)
CO2: 22 mmol/L (ref 22–32)
Calcium: 9.5 mg/dL (ref 8.9–10.3)
Chloride: 103 mmol/L (ref 98–111)
Creatinine, Ser: 1.13 mg/dL (ref 0.61–1.24)
GFR, Estimated: >60 mL/min (ref >60)
Glucose, Bld: 82 mg/dL (ref 70–99)
Potassium: 4.5 mmol/L (ref 3.5–5.1)
Sodium: 135 mmol/L (ref 135–145)

## 2024-04-28 LAB — RAPID URINE DRUG SCREEN, HOSP PERFORMED
Amphetamines: POSITIVE — AB
Barbiturates: NOT DETECTED
Benzodiazepines: POSITIVE — AB
Cocaine: NOT DETECTED
Opiates: NOT DETECTED
Tetrahydrocannabinol: NOT DETECTED

## 2024-04-28 MED ORDER — OLANZAPINE 5 MG PO TBDP
10.0000 mg | ORAL_TABLET | Freq: Three times a day (TID) | ORAL | Status: DC | PRN
  Administered 2024-04-28 – 2024-04-29 (×3): 10 mg via ORAL
  Filled 2024-04-28 (×3): qty 2

## 2024-04-28 MED ORDER — LORAZEPAM 2 MG/ML IJ SOLN
2.0000 mg | Freq: Once | INTRAMUSCULAR | Status: AC
  Administered 2024-04-28: 2 mg via INTRAMUSCULAR
  Filled 2024-04-28: qty 1

## 2024-04-28 MED ORDER — ZIPRASIDONE MESYLATE 20 MG IM SOLR
20.0000 mg | INTRAMUSCULAR | Status: AC | PRN
  Administered 2024-04-28: 20 mg via INTRAMUSCULAR
  Filled 2024-04-28: qty 20

## 2024-04-28 MED ORDER — LORAZEPAM 1 MG PO TABS
1.0000 mg | ORAL_TABLET | ORAL | Status: AC | PRN
  Administered 2024-04-28: 1 mg via ORAL
  Filled 2024-04-28: qty 1

## 2024-04-28 MED ORDER — ZIPRASIDONE MESYLATE 20 MG IM SOLR
20.0000 mg | Freq: Once | INTRAMUSCULAR | Status: AC
  Administered 2024-04-28: 20 mg via INTRAMUSCULAR
  Filled 2024-04-28: qty 20

## 2024-04-28 NOTE — Progress Notes (Addendum)
 Dearion C. Brayboy  MRN: [id number]   Inpatient Placement  Per Elveria Batter, NP, given the patient's extreme behavioral dysregulation and communication limitations overnight observation in the ED is warranted to ensure sustained clinical stability and to allow for reassessment by psychiatry in the a.m.
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
## 2024-04-28 NOTE — Progress Notes (Signed)
 Per face to face conversation with Freddi MD, pts restraints removed.

## 2024-04-28 NOTE — ED Provider Notes (Signed)
 7:16 PM Patient is becoming increasingly agitated again.  He was doing well out of restraints.  Will try some olanzapine  and Ativan  but he may need repeat Geodon  if this does not help.  8:30 PM Patient now agitated again, given IM geodon .   Freddi Hamilton, MD 04/28/24 2147
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
## 2024-04-28 NOTE — ED Notes (Signed)
 Dr. Lenor has the documents to complete the first exam.  Once received, all items will be submitted to e-file and recorded in the e-ivc binder.
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
## 2024-04-28 NOTE — ED Notes (Signed)
 Phlebotomy asked to stick

## 2024-04-28 NOTE — ED Notes (Signed)
 Sister, Glenn [name] [phone], legal guardian
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
## 2024-04-28 NOTE — ED Notes (Signed)
 A regular dinner tray ordered for the patient. Per [facility] staff, the tray should arrive by 7:40pm.
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
## 2024-04-28 NOTE — ED Notes (Signed)
 Pt resting in strecher. Pt family is concerned about night time medications. RN informed family that we will give him his night time medications when he wakes up after the geodon .

## 2024-04-28 NOTE — ED Notes (Signed)
 Multiple attempts at venipuncture, pt is a hard stick.

## 2024-04-29 ENCOUNTER — Telehealth (HOSPITAL_COMMUNITY): Payer: Self-pay | Admitting: Pharmacy Technician

## 2024-04-29 ENCOUNTER — Other Ambulatory Visit (HOSPITAL_COMMUNITY): Payer: Self-pay

## 2024-04-29 MED ORDER — BENZTROPINE MESYLATE 0.5 MG PO TABS
0.5000 mg | ORAL_TABLET | Freq: Every day | ORAL | 0 refills | Status: AC
  Filled 2024-04-29: qty 15, 15d supply, fill #0

## 2024-04-29 MED ORDER — HALOPERIDOL 5 MG PO TABS
5.0000 mg | ORAL_TABLET | Freq: Two times a day (BID) | ORAL | Status: DC
Start: 2024-04-29 — End: 2024-04-29
  Administered 2024-04-29: 5 mg via ORAL
  Filled 2024-04-29: qty 1

## 2024-04-29 MED ORDER — DIVALPROEX SODIUM 250 MG PO DR TAB
500.0000 mg | DELAYED_RELEASE_TABLET | Freq: Two times a day (BID) | ORAL | 0 refills | Status: AC
  Filled 2024-04-29: qty 30, 8d supply, fill #0

## 2024-04-29 MED ORDER — HALOPERIDOL 5 MG PO TABS
5.0000 mg | ORAL_TABLET | Freq: Two times a day (BID) | ORAL | 0 refills | Status: AC
  Filled 2024-04-29: qty 30, 15d supply, fill #0

## 2024-04-29 MED ORDER — LORAZEPAM 2 MG/ML IJ SOLN: 2.0000 mg | Freq: Once | INTRAMUSCULAR | Status: AC

## 2024-04-29 MED ORDER — MIRTAZAPINE 45 MG PO TABS
45.0000 mg | ORAL_TABLET | Freq: Every day | ORAL | 0 refills | Status: AC
  Filled 2024-04-29: qty 15, 15d supply, fill #0

## 2024-04-29 MED ORDER — LORAZEPAM 1 MG PO TABS
1.0000 mg | ORAL_TABLET | Freq: Three times a day (TID) | ORAL | Status: DC | PRN
  Administered 2024-04-29: 1 mg via ORAL
  Filled 2024-04-29: qty 1

## 2024-04-29 MED ORDER — HYDROXYZINE PAMOATE 25 MG PO CAPS
50.0000 mg | ORAL_CAPSULE | Freq: Every day | ORAL | 0 refills | Status: AC
  Filled 2024-04-29: qty 15, 7d supply, fill #0

## 2024-04-29 MED ORDER — ZIPRASIDONE MESYLATE 20 MG IM SOLR
20.0000 mg | Freq: Once | INTRAMUSCULAR | Status: AC
  Administered 2024-04-29: 20 mg via INTRAMUSCULAR
  Filled 2024-04-29: qty 20

## 2024-04-29 MED ORDER — QUETIAPINE FUMARATE 200 MG PO TABS
ORAL_TABLET | ORAL | 0 refills | Status: AC
  Filled 2024-04-29: qty 18, 5d supply, fill #0

## 2024-04-29 MED ORDER — BENZTROPINE MESYLATE 1 MG PO TABS: 0.5000 mg | ORAL_TABLET | Freq: Every day | ORAL | Status: DC

## 2024-04-29 MED ORDER — LORAZEPAM 2 MG/ML IJ SOLN
INTRAMUSCULAR | Status: AC
  Administered 2024-04-29: 2 mg via INTRAMUSCULAR
  Filled 2024-04-29: qty 1

## 2024-04-29 MED ORDER — HYDROXYZINE HCL 50 MG PO TABS: 50.0000 mg | ORAL_TABLET | Freq: Every day | ORAL | Status: DC

## 2024-04-29 MED ORDER — MIRTAZAPINE 15 MG PO TABS
45.0000 mg | ORAL_TABLET | Freq: Every day | ORAL | Status: DC
Start: 2024-04-29 — End: 2024-04-29

## 2024-04-29 MED ORDER — LORAZEPAM 1 MG PO TABS: 2.0000 mg | ORAL_TABLET | Freq: Once | ORAL | Status: DC

## 2024-04-29 MED ORDER — LORAZEPAM 1 MG PO TABS
1.0000 mg | ORAL_TABLET | Freq: Three times a day (TID) | ORAL | 0 refills | Status: AC | PRN
  Filled 2024-04-29: qty 15, 5d supply, fill #0

## 2024-04-29 MED ORDER — DIVALPROEX SODIUM 500 MG PO DR TAB
500.0000 mg | DELAYED_RELEASE_TABLET | Freq: Two times a day (BID) | ORAL | Status: DC
  Administered 2024-04-29: 500 mg via ORAL
  Filled 2024-04-29: qty 2

## 2024-04-29 MED ORDER — QUETIAPINE FUMARATE 200 MG PO TABS
400.0000 mg | ORAL_TABLET | Freq: Two times a day (BID) | ORAL | 0 refills | Status: AC
  Filled 2024-04-29: qty 12, 3d supply, fill #0

## 2024-04-29 MED ORDER — QUETIAPINE FUMARATE 200 MG PO TABS
400.0000 mg | ORAL_TABLET | Freq: Every day | ORAL | Status: DC
Start: 2024-04-29 — End: 2024-04-29

## 2024-04-29 MED ORDER — QUETIAPINE FUMARATE 200 MG PO TABS
400.0000 mg | ORAL_TABLET | Freq: Two times a day (BID) | ORAL | Status: DC
  Administered 2024-04-29: 400 mg via ORAL
  Filled 2024-04-29: qty 2

## 2024-04-29 NOTE — Telephone Encounter (Signed)
 Pharmacy Patient Advocate Encounter  Received notification from [facility] MEDICAID that Prior Authorization for QUEtiapine  Fumarate 200MG  tablets  has been DENIED.  Full denial letter will be uploaded to the media tab. See denial reason below.   PA #/Case ID/Reference #: [id number]

## 2024-04-29 NOTE — ED Provider Notes (Signed)
 The patient was evaluated by psychiatry.  They recommend discharge back to the patient's caregiver.  He will be discharged with return precautions further recommendation.   Ula Prentice SAUNDERS, MD 04/29/24 [phone]

## 2024-04-29 NOTE — ED Notes (Signed)
 Pt walking around in room with sitter at the bedside. Pt agreeable to taking PO meds in applesauce
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
## 2024-04-29 NOTE — ED Notes (Signed)
 PT guardian (sister) called and given update
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
## 2024-04-29 NOTE — Discharge Instructions (Signed)
Please follow up with your doctor and return to the ER for worsening symptoms.

## 2024-04-29 NOTE — ED Notes (Signed)
 The case number has been recorded in the patient's chart and the e-ivc binder in the orange zone.

## 2024-04-29 NOTE — ED Notes (Signed)
 Unable to get vitals due to aggressive behavior

## 2024-04-29 NOTE — ED Notes (Signed)
 PT feeding self, has been calm while released from wrist restraints, tolerated PO meds
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
## 2024-04-29 NOTE — Telephone Encounter (Signed)
 Pharmacy Patient Advocate Encounter   Received notification from Inpatient Request that prior authorization for QUEtiapine  Fumarate 200MG  tablets  is required/requested.   Insurance verification completed.   The patient is insured through [facility] MEDICAID .   Per test claim: PA required; PA submitted to above mentioned insurance via Latent Key/confirmation #/EOC [facility] Status is pending

## 2024-04-29 NOTE — ED Notes (Signed)
 IVC documents re-submitted to the [place] [employer] via e-file.  Case Number: [id number].  The case number has been written on the IVC documents and emailed to Ms. Deloris.  3 copies of the IVC has been transferred to the purple zone with the patient.
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
## 2024-04-29 NOTE — ED Notes (Signed)
 3 copies of the IVC documents verified and are current.  IVC: 04/28/2024 and is set to expire on 05/05/2024.   Located on the clipboard in the green zone.

## 2024-05-12 ENCOUNTER — Telehealth: Payer: Self-pay
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
## 2024-05-12 NOTE — Telephone Encounter (Signed)
 LVM for guardian to call office for what type of harness is needed to be ordered.

## 2024-05-24 ENCOUNTER — Emergency Department (HOSPITAL_COMMUNITY)
Admission: EM | Admit: 2024-05-24 | Discharge: 2024-05-25 | Disposition: A | Payer: MEDICAID | Attending: Emergency Medicine | Admitting: Emergency Medicine

## 2024-05-24 DIAGNOSIS — S025XXA Fracture of tooth (traumatic), initial encounter for closed fracture: Secondary | ICD-10-CM | POA: Diagnosis not present

## 2024-05-24 DIAGNOSIS — Y92009 Unspecified place in unspecified non-institutional (private) residence as the place of occurrence of the external cause: Secondary | ICD-10-CM | POA: Insufficient documentation

## 2024-05-24 DIAGNOSIS — S31824A Puncture wound with foreign body of left buttock, initial encounter: Secondary | ICD-10-CM | POA: Diagnosis not present

## 2024-05-24 DIAGNOSIS — F840 Autistic disorder: Secondary | ICD-10-CM

## 2024-05-24 DIAGNOSIS — S21231A Puncture wound without foreign body of right back wall of thorax without penetration into thoracic cavity, initial encounter: Secondary | ICD-10-CM | POA: Insufficient documentation

## 2024-05-24 DIAGNOSIS — R4689 Other symptoms and signs involving appearance and behavior: Secondary | ICD-10-CM

## 2024-05-24 DIAGNOSIS — S3992XA Unspecified injury of lower back, initial encounter: Secondary | ICD-10-CM | POA: Diagnosis present

## 2024-05-24 LAB — CBC WITH DIFFERENTIAL/PLATELET
Abs Immature Granulocytes: 0.01 K/uL (ref 0.00–0.07)
Basophils Absolute: 0 K/uL (ref 0.0–0.1)
Basophils Relative: 0 %
Eosinophils Absolute: 0 K/uL (ref 0.0–0.5)
Eosinophils Relative: 0 %
HCT: 38.4 % — ABNORMAL LOW (ref 39.0–52.0)
Hemoglobin: 12.2 g/dL — ABNORMAL LOW (ref 13.0–17.0)
Immature Granulocytes: 0 %
Lymphocytes Relative: 29 %
Lymphs Abs: 1.7 K/uL (ref 0.7–4.0)
MCH: 27.9 pg (ref 26.0–34.0)
MCHC: 31.8 g/dL (ref 30.0–36.0)
MCV: 87.7 fL (ref 80.0–100.0)
Monocytes Absolute: 0.5 K/uL (ref 0.1–1.0)
Monocytes Relative: 8 %
Neutro Abs: 3.6 K/uL (ref 1.7–7.7)
Neutrophils Relative %: 63 %
Platelets: 244 K/uL (ref 150–400)
RBC: 4.38 MIL/uL (ref 4.22–5.81)
RDW: 15.6 % — ABNORMAL HIGH (ref 11.5–15.5)
WBC: 5.8 K/uL (ref 4.0–10.5)
nRBC: 0 % (ref 0.0–0.2)

## 2024-05-24 LAB — URINE DRUG SCREEN
Amphetamines: NEGATIVE
Barbiturates: NEGATIVE
Benzodiazepines: POSITIVE — AB
Cocaine: NEGATIVE
Fentanyl: NEGATIVE
Methadone Scn, Ur: NEGATIVE
Opiates: NEGATIVE
Tetrahydrocannabinol: NEGATIVE

## 2024-05-24 LAB — BASIC METABOLIC PANEL WITH GFR
Anion gap: 9 (ref 5–15)
BUN: 16 mg/dL (ref 6–20)
CO2: 26 mmol/L (ref 22–32)
Calcium: 9.8 mg/dL (ref 8.9–10.3)
Chloride: 102 mmol/L (ref 98–111)
Creatinine, Ser: 1 mg/dL (ref 0.61–1.24)
GFR, Estimated: >60 mL/min (ref >60)
Glucose, Bld: 96 mg/dL (ref 70–99)
Potassium: 4.2 mmol/L (ref 3.5–5.1)
Sodium: 138 mmol/L (ref 135–145)

## 2024-05-24 LAB — ETHANOL: Alcohol, Ethyl (B): <15 mg/dL (ref <15)

## 2024-05-24 LAB — SALICYLATE LEVEL: Salicylate Lvl: <7 mg/dL — ABNORMAL LOW (ref 7.0–30.0)

## 2024-05-24 LAB — ACETAMINOPHEN LEVEL: Acetaminophen (Tylenol), Serum: <10 ug/mL — ABNORMAL LOW (ref 10–30)

## 2024-05-24 MED ORDER — LORAZEPAM 1 MG PO TABS
1.0000 mg | ORAL_TABLET | Freq: Once | ORAL | Status: AC
  Administered 2024-05-24: 1 mg via ORAL
  Filled 2024-05-24: qty 1

## 2024-05-24 MED ORDER — HALOPERIDOL 5 MG PO TABS
5.0000 mg | ORAL_TABLET | Freq: Once | ORAL | Status: AC
  Administered 2024-05-24: 5 mg via ORAL
  Filled 2024-05-24: qty 1
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
## 2024-05-24 NOTE — ED Notes (Signed)
 Pt given sandwich and water.
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
## 2024-05-24 NOTE — ED Notes (Signed)
Pt calm/cooperative at this time.
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
## 2024-05-24 NOTE — ED Notes (Signed)
 New restraints applied.

## 2024-05-24 NOTE — ED Notes (Signed)
 PT provided with 3 cups of water.

## 2024-05-25 MED ORDER — HYDROXYZINE HCL 25 MG PO TABS
50.0000 mg | ORAL_TABLET | Freq: Every day | ORAL | Status: DC
  Administered 2024-05-25: 50 mg via ORAL
  Filled 2024-05-25: qty 2

## 2024-05-25 MED ORDER — MIRTAZAPINE 7.5 MG PO TABS
45.0000 mg | ORAL_TABLET | Freq: Every day | ORAL | Status: DC
  Administered 2024-05-25: 45 mg via ORAL
  Filled 2024-05-25: qty 2

## 2024-05-25 MED ORDER — DIVALPROEX SODIUM 500 MG PO DR TAB
500.0000 mg | DELAYED_RELEASE_TABLET | Freq: Two times a day (BID) | ORAL | Status: DC
Start: 2024-05-25 — End: 2024-05-25
  Administered 2024-05-25: 500 mg via ORAL
  Filled 2024-05-25: qty 1

## 2024-05-25 MED ORDER — BENZTROPINE MESYLATE 0.5 MG PO TABS
0.5000 mg | ORAL_TABLET | Freq: Every day | ORAL | Status: DC
  Administered 2024-05-25: 0.5 mg via ORAL
  Filled 2024-05-25: qty 1

## 2024-05-25 MED ORDER — HYDROXYZINE PAMOATE 50 MG PO CAPS: 50.0000 mg | ORAL_CAPSULE | Freq: Every day | ORAL | Status: DC

## 2024-05-25 MED ORDER — QUETIAPINE FUMARATE 100 MG PO TABS
400.0000 mg | ORAL_TABLET | Freq: Two times a day (BID) | ORAL | Status: DC
  Administered 2024-05-25: 400 mg via ORAL
  Filled 2024-05-25: qty 4

## 2024-05-25 MED ORDER — METFORMIN HCL 500 MG PO TABS
500.0000 mg | ORAL_TABLET | Freq: Two times a day (BID) | ORAL | Status: DC
  Administered 2024-05-25: 500 mg via ORAL
  Filled 2024-05-25: qty 1
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
## 2024-05-25 NOTE — ED Notes (Addendum)
 Family member called stating she could not come get the pt until the morning since she took night time medications. Reports there is no one else available to come pick pt up at this time.

## 2024-08-26 ENCOUNTER — Ambulatory Visit: Payer: MEDICAID | Admitting: Internal Medicine

## 2024-08-26 NOTE — Progress Notes (Unsigned)
 " [facility] PRIMARY [facility]ARE [facility] [address] [place] [address] Dept: [phone] Dept Fax: [phone]    Subjective:   Glenn [name] [birth year] 08/26/2024  No chief complaint on file.   HPI: Glenn [name] presents today for re-assessment and management of chronic medical conditions.     The following portions of the patient's history were reviewed and updated as appropriate: past medical history, past surgical history, family history, social history, allergies, medications, and problem list.   Patient Active Problem List   Diagnosis Date Noted   Prediabetes 09/02/2023   Agitation 02/20/2023   Aggressive behavior 02/28/2021   Autism spectrum disorder requiring very substantial support (level 3) 06/27/2018   Past Medical History:  Diagnosis Date   Autism    non-verbal   Diabetes mellitus without complication (HCC)    Mental retardation    Poor dentition    Past Surgical History:  Procedure Laterality Date   DENTAL RESTORATION/EXTRACTION WITH X-RAY Bilateral 01/27/2020   Procedure: DENTAL RESTORATION AND DENTAL EXTRACTION #32 WITH X-RAY;  Surgeon: Tanda Madeleine PARAS, DMD;  Location: [facility] OR;  Service: Dentistry;  Laterality: Bilateral;   Family History  Problem Relation Age of Onset   Asthma Mother    Hypertension Mother    Healthy Father    Current Medications[1] Allergies[2]   ROS: A complete ROS was performed with pertinent positives/negatives noted in the HPI. The remainder of the ROS are negative.    Objective:   There were no vitals filed for this visit.  GENERAL: Well-appearing, in NAD. Well nourished.  SKIN: Pink, warm and dry. No rash, lesion, ulceration, or ecchymoses.  NECK: Trachea midline. Full ROM w/o pain or tenderness. No lymphadenopathy.  RESPIRATORY: Chest wall symmetrical. Respirations even and non-labored. Breath sounds clear to auscultation bilaterally.  CARDIAC: S1, S2 present, regular rate and  rhythm. Peripheral pulses 2+ bilaterally.  MSK: Muscle tone and strength appropriate for age. Joints w/o tenderness, redness, or swelling.  EXTREMITIES: Without clubbing, cyanosis, or edema.  NEUROLOGIC: No motor or sensory deficits. Steady, even gait.  PSYCH/MENTAL STATUS: Alert, oriented x 3. Cooperative, appropriate mood and affect.   Health Maintenance Due  Topic Date Due   DTaP/Tdap/Td (1 - Tdap) Never done   Pneumococcal Vaccine (1 of 2 - PCV) Never done   Hepatitis B Vaccines 19-59 Average Risk (1 of 3 - 19+ 3-dose series) Never done   HPV VACCINES (1 - 3-dose SCDM series) Never done   COVID-19 Vaccine (1 - 2025-26 season) Never done    No results found for any visits on 08/26/24.  The ASCVD Risk score (Arnett DK, et al., 2019) failed to calculate for the following reasons:   The 2019 ASCVD risk score is only valid for ages 63 to 47   * - Cholesterol units were assumed     Assessment & Plan:    There are no diagnoses linked to this encounter. No orders of the defined types were placed in this encounter.  No images are attached to the encounter or orders placed in the encounter. No orders of the defined types were placed in this encounter.   No follow-ups on file.   Rosina Senters, FNP    [1]  Current Outpatient Medications:    benztropine  (COGENTIN ) 0.5 MG tablet, Take 1 tablet (0.5 mg total) by mouth at bedtime., Disp: 15 tablet, Rfl: 0   Continuous Glucose Sensor (FREESTYLE LIBRE 3 PLUS SENSOR) MISC, Change sensor every 15 days., Disp: 2 each,  Rfl: 12   divalproex  (DEPAKOTE ) 250 MG DR tablet, Take 2 tablets (500 mg total) by mouth 2 (two) times daily., Disp: 30 tablet, Rfl: 0   haloperidol  (HALDOL ) 5 MG tablet, Take 1 tablet (5 mg total) by mouth 2 (two) times daily., Disp: 30 tablet, Rfl: 0   hydrOXYzine  (VISTARIL ) 25 MG capsule, Take 2 capsules (50 mg total) by mouth at bedtime., Disp: 15 capsule, Rfl: 0   LORazepam  (ATIVAN ) 0.5 MG tablet, Take 2 mg by mouth 3  (three) times daily as needed., Disp: , Rfl:    LORazepam  (ATIVAN ) 1 MG tablet, Take 1 tablet (1 mg total) by mouth 3 (three) times daily as needed for anxiety (Agitation)., Disp: 15 tablet, Rfl: 0   metFORMIN  (GLUCOPHAGE ) 500 MG tablet, TAKE 1 TABLET BY MOUTH 2 TIMES DAILY WITH A MEAL., Disp: 180 tablet, Rfl: 1   mirtazapine  (REMERON ) 45 MG tablet, Take 1 tablet (45 mg total) by mouth at bedtime., Disp: 15 tablet, Rfl: 0   QUEtiapine  (SEROQUEL ) 200 MG tablet, Take 2 tablets by mouth 2 (two) times daily, Disp: 18 tablet, Rfl: 0   QUEtiapine  (SEROQUEL ) 200 MG tablet, Take 2 tablets (400 mg total) by mouth 2 (two) times daily., Disp: 30 tablet, Rfl: 0 [2] No Known Allergies  "
